# Patient Record
Sex: Female | Born: 2000 | Race: White | Hispanic: No | Smoking: Never smoker
Health system: Southern US, Community
[De-identification: ages and names within clinical notes are randomized; demographics above are authoritative.]

## PROBLEM LIST (undated history)

## (undated) DIAGNOSIS — M419 Scoliosis, unspecified: Secondary | ICD-10-CM

## (undated) DIAGNOSIS — Q760 Spina bifida occulta: Secondary | ICD-10-CM

## (undated) HISTORY — DX: Scoliosis, unspecified: M41.9

## (undated) HISTORY — DX: Spina bifida occulta: Q76.0

## (undated) HISTORY — PX: CHOANAL ADENIODECTOMY: SHX1340

---
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
## 2021-02-02 ENCOUNTER — Other Ambulatory Visit: Payer: Self-pay | Admitting: Obstetrics & Gynecology

## 2021-02-02 DIAGNOSIS — O3680X0 Pregnancy with inconclusive fetal viability, not applicable or unspecified: Secondary | ICD-10-CM

## 2021-02-03 ENCOUNTER — Other Ambulatory Visit: Payer: Self-pay | Admitting: Obstetrics & Gynecology

## 2021-02-03 ENCOUNTER — Other Ambulatory Visit: Payer: Medicaid Other

## 2021-02-03 ENCOUNTER — Other Ambulatory Visit: Payer: Self-pay

## 2021-02-03 ENCOUNTER — Ambulatory Visit (INDEPENDENT_AMBULATORY_CARE_PROVIDER_SITE_OTHER): Payer: Medicaid Other

## 2021-02-03 DIAGNOSIS — O3680X0 Pregnancy with inconclusive fetal viability, not applicable or unspecified: Secondary | ICD-10-CM

## 2021-02-03 DIAGNOSIS — Z3A08 8 weeks gestation of pregnancy: Secondary | ICD-10-CM

## 2021-02-03 NOTE — Progress Notes (Signed)
[place] 5+2 wks GS,no fetal pole or YS visualized,GS 7 mm,normal ovaries,small amount of simple cul de sac fluid ,lab and f/u ultrasound per [employer]

## 2021-02-04 LAB — BETA HCG QUANT (REF LAB): hCG Quant: 4198 m[IU]/mL

## 2021-02-15 ENCOUNTER — Other Ambulatory Visit: Payer: Self-pay | Admitting: Adult Health

## 2021-02-15 DIAGNOSIS — O3680X0 Pregnancy with inconclusive fetal viability, not applicable or unspecified: Secondary | ICD-10-CM

## 2021-02-17 ENCOUNTER — Ambulatory Visit (INDEPENDENT_AMBULATORY_CARE_PROVIDER_SITE_OTHER): Payer: Medicaid Other

## 2021-02-17 ENCOUNTER — Other Ambulatory Visit: Payer: Self-pay

## 2021-02-17 ENCOUNTER — Encounter: Payer: Self-pay | Admitting: Obstetrics & Gynecology

## 2021-02-17 DIAGNOSIS — Z3A10 10 weeks gestation of pregnancy: Secondary | ICD-10-CM | POA: Diagnosis not present

## 2021-02-17 DIAGNOSIS — O3680X0 Pregnancy with inconclusive fetal viability, not applicable or unspecified: Secondary | ICD-10-CM

## 2021-02-17 NOTE — Progress Notes (Signed)
[place] 6+5 wks,single IUP with YS,CRL 8.7 mm,normal ovaries,FHR 125 bpm

## 2021-03-24 ENCOUNTER — Other Ambulatory Visit: Payer: Self-pay | Admitting: Obstetrics & Gynecology

## 2021-03-24 DIAGNOSIS — Z3682 Encounter for antenatal screening for nuchal translucency: Secondary | ICD-10-CM

## 2021-03-25 ENCOUNTER — Other Ambulatory Visit: Payer: Self-pay

## 2021-03-25 ENCOUNTER — Ambulatory Visit: Payer: Medicaid Other | Admitting: *Deleted

## 2021-03-25 ENCOUNTER — Ambulatory Visit (INDEPENDENT_AMBULATORY_CARE_PROVIDER_SITE_OTHER): Payer: Medicaid Other | Admitting: Advanced Practice Midwife

## 2021-03-25 ENCOUNTER — Ambulatory Visit (INDEPENDENT_AMBULATORY_CARE_PROVIDER_SITE_OTHER): Payer: Medicaid Other

## 2021-03-25 ENCOUNTER — Encounter: Payer: Self-pay | Admitting: Advanced Practice Midwife

## 2021-03-25 VITALS — BP 108/75 | HR 76 | Wt 100.5 lb

## 2021-03-25 DIAGNOSIS — Z3402 Encounter for supervision of normal first pregnancy, second trimester: Secondary | ICD-10-CM

## 2021-03-25 DIAGNOSIS — Z3401 Encounter for supervision of normal first pregnancy, first trimester: Secondary | ICD-10-CM

## 2021-03-25 DIAGNOSIS — Z3A11 11 weeks gestation of pregnancy: Secondary | ICD-10-CM

## 2021-03-25 DIAGNOSIS — Z3682 Encounter for antenatal screening for nuchal translucency: Secondary | ICD-10-CM

## 2021-03-25 DIAGNOSIS — Z3400 Encounter for supervision of normal first pregnancy, unspecified trimester: Secondary | ICD-10-CM | POA: Insufficient documentation

## 2021-03-25 LAB — POCT URINALYSIS DIPSTICK OB
Blood, UA: NEGATIVE
Glucose, UA: NEGATIVE
Ketones, UA: NEGATIVE
Leukocytes, UA: NEGATIVE
Nitrite, UA: NEGATIVE
POC,PROTEIN,UA: NEGATIVE

## 2021-03-25 NOTE — Patient Instructions (Signed)
Beverly Hopkins, I greatly value your feedback.  If you receive a survey following your visit with [place] today, we appreciate you taking the time to fill it out.  Thanks, [name], DNP, CNM  [facility] HAS MOVED!!! It is now [facility] ([address] [place], [place] [address]) Entrance located off of [address] Free 24/7 valet parking   Nausea & Vomiting Have saltine crackers or pretzels by your bed and eat a few bites before you raise your head out of bed in the morning Eat small frequent meals throughout the day instead of large meals Drink plenty of fluids throughout the day to stay hydrated, just don't drink a lot of fluids with your meals.  This can make your stomach fill up faster making you feel sick Do not brush your teeth right after you eat Products with real ginger are good for nausea, like ginger ale and ginger hard candy Make sure it says made with real ginger! Sucking on sour candy like lemon heads is also good for nausea If your prenatal vitamins make you nauseated, take them at night so you will sleep through the nausea Sea Bands If you feel like you need medicine for the nausea & vomiting please let us know If you are unable to keep any fluids or food down please let us know   Constipation Drink plenty of fluid, preferably water, throughout the day Eat foods high in fiber such as fruits, vegetables, and grains Exercise, such as walking, is a good way to keep your bowels regular Drink warm fluids, especially warm prune juice, or decaf coffee Eat a 1/2 cup of real oatmeal (not instant), 1/2 cup applesauce, and 1/2-1 cup warm prune juice every day If needed, you may take Colace (docusate sodium) stool softener once or twice a day to help keep the stool soft.  If you still are having problems with constipation, you may take Miralax once daily as needed to help keep your bowels regular.   Home Blood Pressure Monitoring for  Patients   Your provider has recommended that you check your blood pressure (BP) at least once a week at home. If you do not have a blood pressure cuff at home, one will be provided for you. Contact your provider if you have not received your monitor within 1 week.   Helpful Tips for Accurate Home Blood Pressure Checks  Don't smoke, exercise, or drink caffeine 30 minutes before checking your BP Use the restroom before checking your BP (a full bladder can raise your pressure) Relax in a comfortable upright chair Feet on the ground Left arm resting comfortably on a flat surface at the level of your heart Legs uncrossed Back supported Sit quietly and don't talk Place the cuff on your bare arm Adjust snuggly, so that only two fingertips can fit between your skin and the top of the cuff Check 2 readings separated by at least one minute Keep a log of your BP readings For a visual, please reference this diagram: http://ccnc.care/bpdiagram  Provider Name: [facility]     Phone: [phone]  Zone 1: ALL CLEAR  Continue to monitor your symptoms:  BP reading is less than 140 (top number) or less than 90 (bottom number)  No right upper stomach pain No headaches or seeing spots No feeling nauseated or throwing up No swelling in face and hands  Zone 2: CAUTION Call your doctor's office for any of the following:  BP  reading is greater than 140 (top number) or greater than 90 (bottom number)  Stomach pain under your ribs in the middle or right side Headaches or seeing spots Feeling nauseated or throwing up Swelling in face and hands  Zone 3: EMERGENCY  Seek immediate medical care if you have any of the following:  BP reading is greater than160 (top number) or greater than 110 (bottom number) Severe headaches not improving with Tylenol Serious difficulty catching your breath Any worsening symptoms from Zone 2    First Trimester of Pregnancy The first trimester of pregnancy is from  week 1 until the end of week 12 (months 1 through 3). A week after a sperm fertilizes an egg, the egg will implant on the wall of the uterus. This embryo will begin to develop into a baby. Genes from you and your partner are forming the baby. The female genes determine whether the baby is a boy or a girl. At 6-8 weeks, the eyes and face are formed, and the heartbeat can be seen on ultrasound. At the end of 12 weeks, all the baby's organs are formed.  Now that you are pregnant, you will want to do everything you can to have a healthy baby. Two of the most important things are to get good prenatal care and to follow your health care provider's instructions. Prenatal care is all the medical care you receive before the baby's birth. This care will help prevent, find, and treat any problems during the pregnancy and childbirth. BODY CHANGES Your body goes through many changes during pregnancy. The changes vary from woman to woman.  You may gain or lose a couple of pounds at first. You may feel sick to your stomach (nauseous) and throw up (vomit). If the vomiting is uncontrollable, call your health care provider. You may tire easily. You may develop headaches that can be relieved by medicines approved by your health care provider. You may urinate more often. Painful urination may mean you have a bladder infection. You may develop heartburn as a result of your pregnancy. You may develop constipation because certain hormones are causing the muscles that push waste through your intestines to slow down. You may develop hemorrhoids or swollen, bulging veins (varicose veins). Your breasts may begin to grow larger and become tender. Your nipples may stick out more, and the tissue that surrounds them (areola) may become darker. Your gums may bleed and may be sensitive to brushing and flossing. Dark spots or blotches (chloasma, mask of pregnancy) may develop on your face. This will likely fade after the baby is  born. Your menstrual periods will stop. You may have a loss of appetite. You may develop cravings for certain kinds of food. You may have changes in your emotions from day to day, such as being excited to be pregnant or being concerned that something may go wrong with the pregnancy and baby. You may have more vivid and strange dreams. You may have changes in your hair. These can include thickening of your hair, rapid growth, and changes in texture. Some women also have hair loss during or after pregnancy, or hair that feels dry or thin. Your hair will most likely return to normal after your baby is born. WHAT TO EXPECT AT YOUR PRENATAL VISITS During a routine prenatal visit: You will be weighed to make sure you and the baby are growing normally. Your blood pressure will be taken. Your abdomen will be measured to track your baby's growth. The fetal heartbeat  will be listened to starting around week 10 or 12 of your pregnancy. Test results from any previous visits will be discussed. Your health care provider may ask you: How you are feeling. If you are feeling the baby move. If you have had any abnormal symptoms, such as leaking fluid, bleeding, severe headaches, or abdominal cramping. If you have any questions. Other tests that may be performed during your first trimester include: Blood tests to find your blood type and to check for the presence of any previous infections. They will also be used to check for low iron levels (anemia) and Rh antibodies. Later in the pregnancy, blood tests for diabetes will be done along with other tests if problems develop. Urine tests to check for infections, diabetes, or protein in the urine. An ultrasound to confirm the proper growth and development of the baby. An amniocentesis to check for possible genetic problems. Fetal screens for spina bifida and Down syndrome. You may need other tests to make sure you and the baby are doing well. HOME CARE  INSTRUCTIONS  Medicines Follow your health care provider's instructions regarding medicine use. Specific medicines may be either safe or unsafe to take during pregnancy. Take your prenatal vitamins as directed. If you develop constipation, try taking a stool softener if your health care provider approves. Diet Eat regular, well-balanced meals. Choose a variety of foods, such as meat or vegetable-based protein, fish, milk and low-fat dairy products, vegetables, fruits, and whole grain breads and cereals. Your health care provider will help you determine the amount of weight gain that is right for you. Avoid raw meat and uncooked cheese. These carry germs that can cause birth defects in the baby. Eating four or five small meals rather than three large meals a day may help relieve nausea and vomiting. If you start to feel nauseous, eating a few soda crackers can be helpful. Drinking liquids between meals instead of during meals also seems to help nausea and vomiting. If you develop constipation, eat more high-fiber foods, such as fresh vegetables or fruit and whole grains. Drink enough fluids to keep your urine clear or pale yellow. Activity and Exercise Exercise only as directed by your health care provider. Exercising will help you: Control your weight. Stay in shape. Be prepared for labor and delivery. Experiencing pain or cramping in the lower abdomen or low back is a good sign that you should stop exercising. Check with your health care provider before continuing normal exercises. Try to avoid standing for long periods of time. Move your legs often if you must stand in one place for a long time. Avoid heavy lifting. Wear low-heeled shoes, and practice good posture. You may continue to have sex unless your health care provider directs you otherwise. Relief of Pain or Discomfort Wear a good support bra for breast tenderness.   Take warm sitz baths to soothe any pain or discomfort caused by  hemorrhoids. Use hemorrhoid cream if your health care provider approves.   Rest with your legs elevated if you have leg cramps or low back pain. If you develop varicose veins in your legs, wear support hose. Elevate your feet for 15 minutes, 3-4 times a day. Limit salt in your diet. Prenatal Care Schedule your prenatal visits by the twelfth week of pregnancy. They are usually scheduled monthly at first, then more often in the last 2 months before delivery. Write down your questions. Take them to your prenatal visits. Keep all your prenatal visits as directed by  your health care provider. Safety Wear your seat belt at all times when driving. Make a list of emergency phone numbers, including numbers for family, friends, the hospital, and police and fire departments. General Tips Ask your health care provider for a referral to a local prenatal education class. Begin classes no later than at the beginning of month 6 of your pregnancy. Ask for help if you have counseling or nutritional needs during pregnancy. Your health care provider can offer advice or refer you to specialists for help with various needs. Do not use hot tubs, steam rooms, or saunas. Do not douche or use tampons or scented sanitary pads. Do not cross your legs for long periods of time. Avoid cat litter boxes and soil used by cats. These carry germs that can cause birth defects in the baby and possibly loss of the fetus by miscarriage or stillbirth. Avoid all smoking, herbs, alcohol, and medicines not prescribed by your health care provider. Chemicals in these affect the formation and growth of the baby. Schedule a dentist appointment. At home, brush your teeth with a soft toothbrush and be gentle when you floss. SEEK MEDICAL CARE IF:  You have dizziness. You have mild pelvic cramps, pelvic pressure, or nagging pain in the abdominal area. You have persistent nausea, vomiting, or diarrhea. You have a bad smelling vaginal  discharge. You have pain with urination. You notice increased swelling in your face, hands, legs, or ankles. SEEK IMMEDIATE MEDICAL CARE IF:  You have a fever. You are leaking fluid from your vagina. You have spotting or bleeding from your vagina. You have severe abdominal cramping or pain. You have rapid weight gain or loss. You vomit blood or material that looks like coffee grounds. You are exposed to [place] measles and have never had them. You are exposed to fifth disease or chickenpox. You develop a severe headache. You have shortness of breath. You have any kind of trauma, such as from a fall or a car accident. Document Released: 06/21/2001 Document Revised: 11/11/2013 Document Reviewed: 05/07/2013 [facility] Patient Information 2015 [place], [place]. This information is not intended to replace advice given to you by your health care provider. Make sure you discuss any questions you have with your health care provider.  Coronavirus (COVID-19) Are you at risk?  Are you at risk for the Coronavirus (COVID-19)?  To be considered HIGH RISK for Coronavirus (COVID-19), you have to meet the following criteria:  Traveled to [place], [place], [place], [place] or [place];  and have fever, cough, and shortness of breath within the last 2 weeks of travel OR Been in close contact with a person diagnosed with COVID-19 within the last 2 weeks and have fever, cough, and shortness of breath IF YOU DO NOT MEET THESE CRITERIA, YOU ARE CONSIDERED LOW RISK FOR COVID-19.  What to do if you are HIGH RISK for COVID-19?  If you are having a medical emergency, call 911. Seek medical care right away. Before you go to a doctor's office, urgent care or emergency department, call ahead and tell them about your recent travel, contact with someone diagnosed with COVID-19, and your symptoms. You should receive instructions from your physician's office regarding next steps of care.  When you arrive at healthcare provider,  tell the healthcare staff immediately you have returned from visiting [place], [place], [place], [place] or [place]; in the last two weeks or you have been in close contact with a person diagnosed with COVID-19 in the last 2 weeks.  Tell the health care staff about your symptoms: fever, cough and shortness of breath. After you have been seen by a medical provider, you will be either: Tested for (COVID-19) and discharged home on quarantine except to seek medical care if symptoms worsen, and asked to  Stay home and avoid contact with others until you get your results (4-5 days)  Avoid travel on public transportation if possible (such as bus, train, or airplane) or Sent to the Emergency Department by EMS for evaluation, COVID-19 testing, and possible admission depending on your condition and test results.  What to do if you are LOW RISK for COVID-19?  Reduce your risk of any infection by using the same precautions used for avoiding the common cold or flu:  Wash your hands often with soap and warm water for at least 20 seconds.  If soap and water are not readily available, use an alcohol-based hand sanitizer with at least 60% alcohol.  If coughing or sneezing, cover your mouth and nose by coughing or sneezing into the elbow areas of your shirt or coat, into a tissue or into your sleeve (not your hands). Avoid shaking hands with others and consider head nods or verbal greetings only. Avoid touching your eyes, nose, or mouth with unwashed hands.  Avoid close contact with people who are sick. Avoid places or events with large numbers of people in one location, like concerts or sporting events. Carefully consider travel plans you have or are making. If you are planning any travel outside or inside the [place], visit the CDC's Travelers' Health webpage for the latest health notices. If you have some symptoms but not all symptoms, continue to monitor at home and seek medical attention if your symptoms worsen. If  you are having a medical emergency, call 911.   ADDITIONAL HEALTHCARE OPTIONS FOR [place] / e-Visit: [email or web address]         [facility]: [facility]: [id number]                   [facility]: [phone]     Safe Medications in Pregnancy   Acne: Benzoyl Peroxide Salicylic Acid  Backache/Headache: Tylenol: 2 regular strength every 4 hours OR              2 Extra strength every 6 hours  Colds/Coughs/Allergies: Benadryl (alcohol free) 25 mg every 6 hours as needed Breath right strips Claritin Cepacol throat lozenges Chloraseptic throat spray Cold-Eeze- up to three times per day Cough drops, alcohol free Flonase (by prescription only) Guaifenesin Mucinex Robitussin DM (plain only, alcohol free) Saline nasal spray/drops Sudafed (pseudoephedrine) & Actifed ** use only after [redacted] weeks gestation and if you do not have high blood pressure Tylenol Vicks Vaporub Zinc lozenges Zyrtec   Constipation: Colace Ducolax suppositories Fleet enema Glycerin suppositories Metamucil Milk of magnesia Miralax Senokot Smooth move tea  Diarrhea: Kaopectate Imodium A-D  *NO pepto Bismol  Hemorrhoids: Anusol Anusol HC Preparation H Tucks  Indigestion: Tums Maalox Mylanta Zantac  Pepcid  Insomnia: Benadryl (alcohol free) 36m every 6 hours as needed Tylenol PM Unisom, no Gelcaps  Leg Cramps: Tums MagGel  Nausea/Vomiting:  Bonine Dramamine Emetrol Ginger extract Sea bands Meclizine  Nausea medication to take during pregnancy:  Unisom (doxylamine succinate 25 mg tablets) Take one tablet daily at bedtime. If symptoms are not adequately controlled, the dose can be increased to a maximum recommended dose of two tablets daily (1/2 tablet in  the morning, 1/2 tablet mid-afternoon and one at bedtime). Vitamin B6 160m tablets. Take one  tablet twice a day (up to 200 mg per day).  Skin Rashes: Aveeno products Benadryl cream or 250mevery 6 hours as needed Calamine Lotion 1% cortisone cream  Yeast infection: Gyne-lotrimin 7 Monistat 7   **If taking multiple medications, please check labels to avoid duplicating the same active ingredients **take medication as directed on the label ** Do not exceed 4000 mg of tylenol in 24 hours **Do not take medications that contain aspirin or ibuprofen

## 2021-03-25 NOTE — Progress Notes (Signed)
[place] 11+6 wks,measurements c/w dates,CRL 57.90 mm,FHR 155 bpm,NB present,NT 1.6 mm,normal ovaries

## 2021-03-25 NOTE — Progress Notes (Signed)
INITIAL OBSTETRICAL VISIT Patient name: Beverly Hopkins MRN [id number]  Date of birth: [birth year] Chief Complaint:   Initial Prenatal Visit  History of Present Illness:   Beverly Hopkins is a 20 y.o. G1P0 Caucasian female at [redacted]w[redacted]d by [place] at 6 weeks with an Estimated Date of Delivery: 10/08/21 being seen today for her initial obstetrical visit.   Her obstetrical history is significant for first baby.   Today she reports no complaints.  Depression screen [facility] 2/9 03/25/2021  Decreased Interest 0  Down, Depressed, Hopeless 0  PHQ - 2 Score 0  Altered sleeping 1  Tired, decreased energy 0  Change in appetite 0  Feeling bad or failure about yourself  0  Trouble concentrating 0  Moving slowly or fidgety/restless 0  Suicidal thoughts 0  PHQ-9 Score 1    Patient's last menstrual period was 12/07/2020 (approximate). Last pap n/a. Review of Systems:   Pertinent items are noted in HPI Denies cramping/contractions, leakage of fluid, vaginal bleeding, abnormal vaginal discharge w/ itching/odor/irritation, headaches, visual changes, shortness of breath, chest pain, abdominal pain, severe nausea/vomiting, or problems with urination or bowel movements unless otherwise stated above.  Pertinent History Reviewed:  Reviewed past medical,surgical, social, obstetrical and family history.  Reviewed problem list, medications and allergies. OB History  Gravida Para Term Preterm AB Living  1            SAB IAB Ectopic Multiple Live Births               # Outcome Date GA Lbr Len/2nd Weight Sex Delivery Anes PTL Lv  1 Current            Physical Assessment:   Vitals:   03/25/21 0852  BP: 108/75  Pulse: 76  Weight: 100 lb 8 oz (45.6 kg)  There is no height or weight on file to calculate BMI.       Physical Examination:  General appearance - well appearing, and in no distress  Mental status - alert, oriented to person, place, and time  Psych:  She has a normal mood and affect  Skin - warm and  dry, normal color, no suspicious lesions noted  Chest - effort normal  Heart - normal rate and regular rhythm  Abdomen - soft, nontender  Extremities:  No swelling or varicosities noted   TODAY'S NT [place] 11+6 wks,measurements c/w dates,CRL 57.90 mm,FHR 155 bpm,NB present,NT 1.6 mm,normal ovaries  Results for orders placed or performed in visit on 03/25/21 (from the past 24 hour(s))  POC Urinalysis Dipstick OB   Collection Time: 03/25/21  9:36 AM  Result Value Ref Range   Color, UA     Clarity, UA     Glucose, UA Negative Negative   Bilirubin, UA     Ketones, UA neg    Spec Grav, UA     Blood, UA neg    pH, UA     POC,PROTEIN,UA Negative Negative, Trace, Small (1+), Moderate (2+), Large (3+), 4+   Urobilinogen, UA     Nitrite, UA neg    Leukocytes, UA Negative Negative   Appearance     Odor      Assessment & Plan:  1) Low-Risk Pregnancy G1P0 at [redacted]w[redacted]d with an Estimated Date of Delivery: 10/08/21   2) Initial OB visit  3) Spina bifida oculta/mild scoliosis:  MRI from 2019 in care everywhere, note sent to anesthesia to review/make a recommendation   Meds: No orders of the defined types were placed in this  encounter.   Initial labs obtained Continue prenatal vitamins Reviewed n/v relief measures and warning s/s to report Reviewed recommended weight gain based on pre-gravid BMI Encouraged well-balanced diet Genetic & carrier screening discussed: requests Panorama, NT/IT, and Horizon , declines AFP Ultrasound discussed; fetal survey: requested [facility] completed> form faxed if has or is planning to apply for medicaid The nature of [facility] [employer] with multiple MDs and other Advanced Practice Providers was explained to patient; also emphasized that fellows, residents, and students are part of our team. Has home bp cuff.. Check bp weekly, let us know if >140/90.        Scarlette Calico Cresenzo-Dishmon 7:52 PM

## 2021-03-26 LAB — PMP SCREEN PROFILE (10S), URINE
Amphetamine Scrn, Ur: NEGATIVE ng/mL
BARBITURATE SCREEN URINE: NEGATIVE ng/mL
BENZODIAZEPINE SCREEN, URINE: NEGATIVE ng/mL
CANNABINOIDS UR QL SCN: NEGATIVE ng/mL
Cocaine (Metab) Scrn, Ur: NEGATIVE ng/mL
Creatinine(Crt), U: 96.2 mg/dL (ref 20.0–300.0)
Methadone Screen, Urine: NEGATIVE ng/mL
OXYCODONE+OXYMORPHONE UR QL SCN: NEGATIVE ng/mL
Opiate Scrn, Ur: NEGATIVE ng/mL
Ph of Urine: 8.5 (ref 4.5–8.9)
Phencyclidine Qn, Ur: NEGATIVE ng/mL
Propoxyphene Scrn, Ur: NEGATIVE ng/mL

## 2021-03-27 LAB — CBC/D/PLT+RPR+RH+ABO+RUBIGG...
Antibody Screen: NEGATIVE
Basophils Absolute: 0 10*3/uL (ref 0.0–0.2)
Basos: 0 %
EOS (ABSOLUTE): 0.1 10*3/uL (ref 0.0–0.4)
Eos: 1 %
HCV Ab: <0.1 s/co ratio (ref 0.0–0.9)
HIV Screen 4th Generation wRfx: NONREACTIVE
Hematocrit: 40 % (ref 34.0–46.6)
Hemoglobin: 13.4 g/dL (ref 11.1–15.9)
Hepatitis B Surface Ag: NEGATIVE
Immature Grans (Abs): 0 10*3/uL (ref 0.0–0.1)
Immature Granulocytes: 0 %
Lymphocytes Absolute: 2 10*3/uL (ref 0.7–3.1)
Lymphs: 23 %
MCH: 30.4 pg (ref 26.6–33.0)
MCHC: 33.5 g/dL (ref 31.5–35.7)
MCV: 91 fL (ref 79–97)
Monocytes Absolute: 0.5 10*3/uL (ref 0.1–0.9)
Monocytes: 6 %
Neutrophils Absolute: 6.2 10*3/uL (ref 1.4–7.0)
Neutrophils: 70 %
Platelets: 358 10*3/uL (ref 150–450)
RBC: 4.41 x10E6/uL (ref 3.77–5.28)
RDW: 12.3 % (ref 11.7–15.4)
RPR Ser Ql: NONREACTIVE
Rh Factor: POSITIVE
Rubella Antibodies, IGG: 1.29 index (ref >0.99)
WBC: 8.8 10*3/uL (ref 3.4–10.8)

## 2021-03-27 LAB — INTEGRATED 1
Crown Rump Length: 57.9 mm
Gest. Age on Collection Date: 12.1 weeks
Maternal Age at EDD: 19.3 yr
Nuchal Translucency (NT): 1.6 mm
Number of Fetuses: 1
PAPP-A Value: 1182.5 ng/mL
Weight: 100 [lb_av]

## 2021-03-27 LAB — HCV INTERPRETATION

## 2021-03-28 LAB — GC/CHLAMYDIA PROBE AMP
Chlamydia trachomatis, NAA: NEGATIVE
Neisseria Gonorrhoeae by PCR: NEGATIVE

## 2021-03-31 ENCOUNTER — Other Ambulatory Visit: Payer: Self-pay | Admitting: Advanced Practice Midwife

## 2021-03-31 DIAGNOSIS — R8271 Bacteriuria: Secondary | ICD-10-CM | POA: Insufficient documentation

## 2021-03-31 LAB — URINE CULTURE

## 2021-03-31 MED ORDER — SULFAMETHOXAZOLE-TRIMETHOPRIM 800-160 MG PO TABS: 1.0000 | ORAL_TABLET | Freq: Two times a day (BID) | ORAL | 0 refills | Status: DC

## 2021-03-31 NOTE — Progress Notes (Signed)
Septra for ASB/PCN allergy

## 2021-04-14 ENCOUNTER — Encounter: Payer: Self-pay | Admitting: Advanced Practice Midwife

## 2021-04-22 ENCOUNTER — Ambulatory Visit (INDEPENDENT_AMBULATORY_CARE_PROVIDER_SITE_OTHER): Payer: Medicaid Other | Admitting: Obstetrics & Gynecology

## 2021-04-22 ENCOUNTER — Other Ambulatory Visit: Payer: Self-pay

## 2021-04-22 ENCOUNTER — Encounter: Payer: Self-pay | Admitting: Obstetrics & Gynecology

## 2021-04-22 VITALS — BP 99/64 | HR 88 | Wt 107.0 lb

## 2021-04-22 DIAGNOSIS — Z3402 Encounter for supervision of normal first pregnancy, second trimester: Secondary | ICD-10-CM

## 2021-04-22 DIAGNOSIS — Z3A15 15 weeks gestation of pregnancy: Secondary | ICD-10-CM

## 2021-04-22 NOTE — Progress Notes (Signed)
   LOW-RISK PREGNANCY VISIT Patient name: Beverly Hopkins MRN [id number]  Date of birth: 10/20/2000 Chief Complaint:   Routine Prenatal Visit  History of Present Illness:   Beverly Hopkins is a 20 y.o. G1P0 female at [redacted]w[redacted]d with an Estimated Date of Delivery: 10/08/21 being seen today for ongoing management of a low-risk pregnancy.  Depression screen [facility] 2/9 03/25/2021  Decreased Interest 0  Down, Depressed, Hopeless 0  PHQ - 2 Score 0  Altered sleeping 1  Tired, decreased energy 0  Change in appetite 0  Feeling bad or failure about yourself  0  Trouble concentrating 0  Moving slowly or fidgety/restless 0  Suicidal thoughts 0  PHQ-9 Score 1    Today she reports no complaints. Contractions: Not present. Vag. Bleeding: None.  Movement: Absent. denies leaking of fluid. Review of Systems:   Pertinent items are noted in HPI Denies abnormal vaginal discharge w/ itching/odor/irritation, headaches, visual changes, shortness of breath, chest pain, abdominal pain, severe nausea/vomiting, or problems with urination or bowel movements unless otherwise stated above. Pertinent History Reviewed:  Reviewed past medical,surgical, social, obstetrical and family history.  Reviewed problem list, medications and allergies. Physical Assessment:   Vitals:   04/22/21 0910  BP: 99/64  Pulse: 88  Weight: 107 lb (48.5 kg)  There is no height or weight on file to calculate BMI.        Physical Examination:   General appearance: Well appearing, and in no distress  Mental status: Alert, oriented to person, place, and time  Skin: Warm & dry  Cardiovascular: Normal heart rate noted  Respiratory: Normal respiratory effort, no distress  Abdomen: Soft, gravid, nontender  Pelvic: Cervical exam deferred         Extremities: Edema: None  Fetal Status:     Movement: Absent    Chaperone: n/a    No results found for this or any previous visit (from the past 24 hour(s)).  Assessment & Plan:  1) Low-risk  pregnancy G1P0 at [redacted]w[redacted]d with an Estimated Date of Delivery: 10/08/21   2) recommended prenatal yoga due to back issues,    Meds: No orders of the defined types were placed in this encounter.  Labs/procedures today: PN2  Plan:  Continue routine obstetrical care  Next visit: prefers in person    Reviewed: Preterm labor symptoms and general obstetric precautions including but not limited to vaginal bleeding, contractions, leaking of fluid and fetal movement were reviewed in detail with the patient.  All questions were answered.  home bp cuff. Rx faxed to . Check bp weekly, let us know if >140/90.   Follow-up: Return in about 4 weeks (around 05/20/2021) for 20 week sono, LROB.  Orders Placed This Encounter  Procedures   US OB Comp + 14 Wk   INTEGRATED 2    Lazaro Arms, MD 04/22/2021 9:52 AM

## 2021-04-24 LAB — INTEGRATED 2
AFP MoM: 1.15
Alpha-Fetoprotein: 48.1 ng/mL
Crown Rump Length: 57.9 mm
DIA MoM: 1.2
DIA Value: 249.8 pg/mL
Estriol, Unconjugated: 1.72 ng/mL
Gest. Age on Collection Date: 12.1 weeks
Gestational Age: 16.1 weeks
Maternal Age at EDD: 19.3 yr
Nuchal Translucency (NT): 1.6 mm
Nuchal Translucency MoM: 1.24
Number of Fetuses: 1
PAPP-A MoM: 0.89
PAPP-A Value: 1182.5 ng/mL
Test Results:: NEGATIVE
Weight: 100 [lb_av]
Weight: 100 [lb_av]
hCG MoM: 1.16
hCG Value: 53.3 IU/mL
uE3 MoM: 1.61

## 2021-05-19 ENCOUNTER — Other Ambulatory Visit: Payer: Self-pay | Admitting: Obstetrics & Gynecology

## 2021-05-19 DIAGNOSIS — Z3402 Encounter for supervision of normal first pregnancy, second trimester: Secondary | ICD-10-CM

## 2021-05-19 DIAGNOSIS — Z363 Encounter for antenatal screening for malformations: Secondary | ICD-10-CM

## 2021-05-20 ENCOUNTER — Ambulatory Visit (INDEPENDENT_AMBULATORY_CARE_PROVIDER_SITE_OTHER): Payer: Medicaid Other

## 2021-05-20 ENCOUNTER — Encounter: Payer: Self-pay | Admitting: Women's Health

## 2021-05-20 ENCOUNTER — Ambulatory Visit (INDEPENDENT_AMBULATORY_CARE_PROVIDER_SITE_OTHER): Payer: Medicaid Other | Admitting: Women's Health

## 2021-05-20 ENCOUNTER — Other Ambulatory Visit: Payer: Self-pay

## 2021-05-20 VITALS — BP 108/67 | HR 87 | Wt 113.0 lb

## 2021-05-20 DIAGNOSIS — Z23 Encounter for immunization: Secondary | ICD-10-CM | POA: Diagnosis not present

## 2021-05-20 DIAGNOSIS — Z3A19 19 weeks gestation of pregnancy: Secondary | ICD-10-CM | POA: Diagnosis not present

## 2021-05-20 DIAGNOSIS — Z363 Encounter for antenatal screening for malformations: Secondary | ICD-10-CM

## 2021-05-20 DIAGNOSIS — R8271 Bacteriuria: Secondary | ICD-10-CM

## 2021-05-20 DIAGNOSIS — Z3402 Encounter for supervision of normal first pregnancy, second trimester: Secondary | ICD-10-CM

## 2021-05-20 DIAGNOSIS — O2342 Unspecified infection of urinary tract in pregnancy, second trimester: Secondary | ICD-10-CM

## 2021-05-20 LAB — POCT URINALYSIS DIPSTICK OB
Blood, UA: NEGATIVE
Glucose, UA: NEGATIVE
Ketones, UA: NEGATIVE
Leukocytes, UA: NEGATIVE
Nitrite, UA: NEGATIVE
POC,PROTEIN,UA: NEGATIVE

## 2021-05-20 NOTE — Progress Notes (Signed)
[place] 19+6 wks,cephalic,cx 4.1 cm,posterior placenta gr 0,FHR 150 bpm,normal ovaries,svp of fluid 3.9 cm,EFW 342 g 68%,anatomy complete,no obvious abnormalities

## 2021-05-20 NOTE — Patient Instructions (Signed)
Beverly Hopkins, thank you for choosing our office today! We appreciate the opportunity to meet your healthcare needs. You may receive a short survey by mail, e-mail, or through [employer]. If you are happy with your care we would appreciate if you could take just a few minutes to complete the survey questions. We read all of your comments and take your feedback very seriously. Thank you again for choosing our office.  Center for [employer] Team at [facility] at [facility] ([address] [place], [place] [address]) Entrance C, located off of [address] Free 24/7 valet parking  Go to [email or web address] to register for FREE online childbirth classes  Call the office [phone]) or go to [facility] if: You begin to severe cramping Your water breaks.  Sometimes it is a big gush of fluid, sometimes it is just a trickle that keeps getting your panties wet or running down your legs You have vaginal bleeding.  It is normal to have a small amount of spotting if your cervix was checked.   [facility] Pediatricians/Family Doctors [facility]): [address] Colette Ribas, [phone]           [facility]: [address], [phone]                [facility] Medicine [facility]): [address], [phone] (call to ask if accepting patients) [facility] Department: [address], [place], [phone]    [facility] Pediatricians/Family Doctors [facility]): [phone] [address], [phone] [facility]: [address] [place], [phone] [facility]: [address], [phone]  [facility] Doctors  [facility]): [phone] [facility]: [address] [phone]   [facility] Doctors [facility]: [address], [phone]  [facility] Doctors  [facility]: [phone], [phone]  Home Blood Pressure Monitoring for Patients   Your provider has recommended that you check your blood pressure (BP) at least once a week at home. If you do not have a blood pressure cuff at home, one will be provided for you. Contact your provider if you have not received your monitor within 1 week.   Helpful Tips for Accurate Home Blood Pressure Checks  Don't smoke, exercise, or drink caffeine 30 minutes before checking your BP Use the restroom before checking your BP (a full bladder can raise your pressure) Relax in a comfortable upright chair Feet on the ground Left arm resting comfortably on a flat surface at the level of your heart Legs uncrossed Back supported Sit quietly and don't talk Place the cuff on your bare arm Adjust snuggly, so that only two fingertips can fit between your skin and the top of the cuff Check 2 readings separated by at least one minute Keep a log of your BP readings For a visual, please reference this diagram: http://ccnc.care/bpdiagram  Provider Name: [facility]     Phone: [phone]  Zone 1: ALL CLEAR  Continue to monitor your symptoms:  BP reading is less than 140 (top number) or less than 90 (bottom number)  No right upper stomach pain No headaches or seeing spots No feeling nauseated or throwing up No swelling in face and hands  Zone 2: CAUTION Call your doctor's office for any of the following:  BP reading is greater than 140 (top number) or greater than  90 (bottom number)  Stomach pain under your ribs in the middle or right side Headaches or seeing spots Feeling nauseated or throwing up Swelling in face and hands  Zone 3: EMERGENCY  Seek immediate medical care if you have any of the following:  BP reading is greater than160 (top number) or greater than 110 (bottom number) Severe headaches not improving with Tylenol Serious difficulty catching your breath Any worsening symptoms from  Zone 2     Second Trimester of Pregnancy The second trimester is from week 14 through week 27 (months 4 through 6). The second trimester is often a time when you feel your best. Your body has adjusted to being pregnant, and you begin to feel better physically. Usually, morning sickness has lessened or quit completely, you may have more energy, and you may have an increase in appetite. The second trimester is also a time when the fetus is growing rapidly. At the end of the sixth month, the fetus is about 9 inches long and weighs about 1 pounds. You will likely begin to feel the baby move (quickening) between 16 and 20 weeks of pregnancy. Body changes during your second trimester Your body continues to go through many changes during your second trimester. The changes vary from woman to woman. Your weight will continue to increase. You will notice your lower abdomen bulging out. You may begin to get stretch marks on your hips, abdomen, and breasts. You may develop headaches that can be relieved by medicines. The medicines should be approved by your health care provider. You may urinate more often because the fetus is pressing on your bladder. You may develop or continue to have heartburn as a result of your pregnancy. You may develop constipation because certain hormones are causing the muscles that push waste through your intestines to slow down. You may develop hemorrhoids or swollen, bulging veins (varicose veins). You may have back pain. This is caused by: Weight gain. Pregnancy hormones that are relaxing the joints in your pelvis. A shift in weight and the muscles that support your balance. Your breasts will continue to grow and they will continue to become tender. Your gums may bleed and may be sensitive to brushing and flossing. Dark spots or blotches (chloasma, mask of pregnancy) may develop on your face. This will likely fade after the baby is born. A dark line from your belly button to  the pubic area (linea nigra) may appear. This will likely fade after the baby is born. You may have changes in your hair. These can include thickening of your hair, rapid growth, and changes in texture. Some women also have hair loss during or after pregnancy, or hair that feels dry or thin. Your hair will most likely return to normal after your baby is born.  What to expect at prenatal visits During a routine prenatal visit: You will be weighed to make sure you and the fetus are growing normally. Your blood pressure will be taken. Your abdomen will be measured to track your baby's growth. The fetal heartbeat will be listened to. Any test results from the previous visit will be discussed.  Your health care provider may ask you: How you are feeling. If you are feeling the baby move. If you have had any abnormal symptoms, such as leaking fluid, bleeding, severe headaches, or abdominal cramping. If you are using any tobacco products, including cigarettes, chewing tobacco, and electronic cigarettes. If you have any questions.  Other tests that may be performed during   your second trimester include: Blood tests that check for: Low iron levels (anemia). High blood sugar that affects pregnant women (gestational diabetes) between 24 and 28 weeks. Rh antibodies. This is to check for a protein on red blood cells (Rh factor). Urine tests to check for infections, diabetes, or protein in the urine. An ultrasound to confirm the proper growth and development of the baby. An amniocentesis to check for possible genetic problems. Fetal screens for spina bifida and Down syndrome. HIV (human immunodeficiency virus) testing. Routine prenatal testing includes screening for HIV, unless you choose not to have this test.  Follow these instructions at home: Medicines Follow your health care provider's instructions regarding medicine use. Specific medicines may be either safe or unsafe to take during  pregnancy. Take a prenatal vitamin that contains at least 600 micrograms (mcg) of folic acid. If you develop constipation, try taking a stool softener if your health care provider approves. Eating and drinking Eat a balanced diet that includes fresh fruits and vegetables, whole grains, good sources of protein such as meat, eggs, or tofu, and low-fat dairy. Your health care provider will help you determine the amount of weight gain that is right for you. Avoid raw meat and uncooked cheese. These carry germs that can cause birth defects in the baby. If you have low calcium intake from food, talk to your health care provider about whether you should take a daily calcium supplement. Limit foods that are high in fat and processed sugars, such as fried and sweet foods. To prevent constipation: Drink enough fluid to keep your urine clear or pale yellow. Eat foods that are high in fiber, such as fresh fruits and vegetables, whole grains, and beans. Activity Exercise only as directed by your health care provider. Most women can continue their usual exercise routine during pregnancy. Try to exercise for 30 minutes at least 5 days a week. Stop exercising if you experience uterine contractions. Avoid heavy lifting, wear low heel shoes, and practice good posture. A sexual relationship may be continued unless your health care provider directs you otherwise. Relieving pain and discomfort Wear a good support bra to prevent discomfort from breast tenderness. Take warm sitz baths to soothe any pain or discomfort caused by hemorrhoids. Use hemorrhoid cream if your health care provider approves. Rest with your legs elevated if you have leg cramps or low back pain. If you develop varicose veins, wear support hose. Elevate your feet for 15 minutes, 3-4 times a day. Limit salt in your diet. Prenatal Care Write down your questions. Take them to your prenatal visits. Keep all your prenatal visits as told by your health  care provider. This is important. Safety Wear your seat belt at all times when driving. Make a list of emergency phone numbers, including numbers for family, friends, the hospital, and police and fire departments. General instructions Ask your health care provider for a referral to a local prenatal education class. Begin classes no later than the beginning of month 6 of your pregnancy. Ask for help if you have counseling or nutritional needs during pregnancy. Your health care provider can offer advice or refer you to specialists for help with various needs. Do not use hot tubs, steam rooms, or saunas. Do not douche or use tampons or scented sanitary pads. Do not cross your legs for long periods of time. Avoid cat litter boxes and soil used by cats. These carry germs that can cause birth defects in the baby and possibly loss of the   fetus by miscarriage or stillbirth. Avoid all smoking, herbs, alcohol, and unprescribed drugs. Chemicals in these products can affect the formation and growth of the baby. Do not use any products that contain nicotine or tobacco, such as cigarettes and e-cigarettes. If you need help quitting, ask your health care provider. Visit your dentist if you have not gone yet during your pregnancy. Use a soft toothbrush to brush your teeth and be gentle when you floss. Contact a health care provider if: You have dizziness. You have mild pelvic cramps, pelvic pressure, or nagging pain in the abdominal area. You have persistent nausea, vomiting, or diarrhea. You have a bad smelling vaginal discharge. You have pain when you urinate. Get help right away if: You have a fever. You are leaking fluid from your vagina. You have spotting or bleeding from your vagina. You have severe abdominal cramping or pain. You have rapid weight gain or weight loss. You have shortness of breath with chest pain. You notice sudden or extreme swelling of your face, hands, ankles, feet, or legs. You  have not felt your baby move in over an hour. You have severe headaches that do not go away when you take medicine. You have vision changes. Summary The second trimester is from week 14 through week 27 (months 4 through 6). It is also a time when the fetus is growing rapidly. Your body goes through many changes during pregnancy. The changes vary from woman to woman. Avoid all smoking, herbs, alcohol, and unprescribed drugs. These chemicals affect the formation and growth your baby. Do not use any tobacco products, such as cigarettes, chewing tobacco, and e-cigarettes. If you need help quitting, ask your health care provider. Contact your health care provider if you have any questions. Keep all prenatal visits as told by your health care provider. This is important. This information is not intended to replace advice given to you by your health care provider. Make sure you discuss any questions you have with your health care provider. Document Released: 06/21/2001 Document Revised: 12/03/2015 Document Reviewed: 08/28/2012 Elsevier Interactive Patient Education  2017 Elsevier Inc.  

## 2021-05-20 NOTE — Progress Notes (Signed)
LOW-RISK PREGNANCY VISIT Patient name: Beverly Hopkins MRN [id number]  Date of birth: December 18, 2000 Chief Complaint:   Routine Prenatal Visit and Pregnancy Ultrasound  History of Present Illness:   Beverly Hopkins is a 20 y.o. G1P0 female at [redacted]w[redacted]d with an Estimated Date of Delivery: 10/08/21 being seen today for ongoing management of a low-risk pregnancy.   Today she reports no complaints. Contractions: Not present. Vag. Bleeding: None.  Movement: Present. denies leaking of fluid.  Depression screen [facility] 2/9 03/25/2021  Decreased Interest 0  Down, Depressed, Hopeless 0  PHQ - 2 Score 0  Altered sleeping 1  Tired, decreased energy 0  Change in appetite 0  Feeling bad or failure about yourself  0  Trouble concentrating 0  Moving slowly or fidgety/restless 0  Suicidal thoughts 0  PHQ-9 Score 1     GAD 7 : Generalized Anxiety Score 03/25/2021  Nervous, Anxious, on Edge 0  Control/stop worrying 0  Worry too much - different things 0  Trouble relaxing 0  Restless 0  Easily annoyed or irritable 1  Afraid - awful might happen 0  Total GAD 7 Score 1      Review of Systems:   Pertinent items are noted in HPI Denies abnormal vaginal discharge w/ itching/odor/irritation, headaches, visual changes, shortness of breath, chest pain, abdominal pain, severe nausea/vomiting, or problems with urination or bowel movements unless otherwise stated above. Pertinent History Reviewed:  Reviewed past medical,surgical, social, obstetrical and family history.  Reviewed problem list, medications and allergies. Physical Assessment:   Vitals:   05/20/21 1033  BP: 108/67  Pulse: 87  Weight: 113 lb (51.3 kg)  There is no height or weight on file to calculate BMI.        Physical Examination:   General appearance: Well appearing, and in no distress  Mental status: Alert, oriented to person, place, and time  Skin: Warm & dry  Cardiovascular: Normal heart rate noted  Respiratory: Normal  respiratory effort, no distress  Abdomen: Soft, gravid, nontender  Pelvic: Cervical exam deferred         Extremities: Edema: None  Fetal Status: Fetal Heart Rate (bpm): 150 u/s   Movement: Present  [place] 19+6 wks,cephalic,cx 4.1 cm,posterior placenta gr 0,FHR 150 bpm,normal ovaries,svp of fluid 3.9 cm,EFW 342 g 68%,anatomy complete,no obvious abnormalities   Chaperone: N/A   Results for orders placed or performed in visit on 05/20/21 (from the past 24 hour(s))  POC Urinalysis Dipstick OB   Collection Time: 05/20/21 10:38 AM  Result Value Ref Range   Color, UA     Clarity, UA     Glucose, UA Negative Negative   Bilirubin, UA     Ketones, UA neg    Spec Grav, UA     Blood, UA neg    pH, UA     POC,PROTEIN,UA Negative Negative, Trace, Small (1+), Moderate (2+), Large (3+), 4+   Urobilinogen, UA     Nitrite, UA neg    Leukocytes, UA Negative Negative   Appearance     Odor      Assessment & Plan:  1) Low-risk pregnancy G1P0 at [redacted]w[redacted]d with an Estimated Date of Delivery: 10/08/21   2) Recent ASB, urine cx poc today   Meds: No orders of the defined types were placed in this encounter.  Labs/procedures today: U/S and urine culture  Plan:  Continue routine obstetrical care  Next visit: prefers in person    Reviewed: Preterm labor symptoms and general obstetric precautions  including but not limited to vaginal bleeding, contractions, leaking of fluid and fetal movement were reviewed in detail with the patient.  All questions were answered. Does have home bp cuff. Office bp cuff given: not applicable. Check bp weekly, let us know if consistently >140 and/or >90.  Follow-up: Return in about 4 weeks (around 06/17/2021) for LROB, CNM, in person.  Future Appointments  Date Time Provider Department Center  06/17/2021  8:50 AM Cheral Marker, CNM [facility]    Orders Placed This Encounter  Procedures   Urine Culture   POC Urinalysis Dipstick OB   Cheral Marker CNM,  [facility] 05/20/2021 11:15 AM

## 2021-05-26 LAB — URINE CULTURE

## 2021-06-17 ENCOUNTER — Encounter: Payer: Self-pay | Admitting: Women's Health

## 2021-06-17 ENCOUNTER — Ambulatory Visit (INDEPENDENT_AMBULATORY_CARE_PROVIDER_SITE_OTHER): Payer: Medicaid Other | Admitting: Women's Health

## 2021-06-17 ENCOUNTER — Other Ambulatory Visit: Payer: Self-pay

## 2021-06-17 VITALS — BP 109/73 | HR 97 | Wt 126.0 lb

## 2021-06-17 DIAGNOSIS — Z3402 Encounter for supervision of normal first pregnancy, second trimester: Secondary | ICD-10-CM

## 2021-06-17 NOTE — Patient Instructions (Signed)
Beverly Hopkins, thank you for choosing our office today! We appreciate the opportunity to meet your healthcare needs. You may receive a short survey by mail, e-mail, or through [employer]. If you are happy with your care we would appreciate if you could take just a few minutes to complete the survey questions. We read all of your comments and take your feedback very seriously. Thank you again for choosing our office.  Center for [employer] Team at [facility]  [facility] at [facility] ([address] [place], [place] [address]) Entrance C, located off of [address] Free 24/7 valet parking   You will have your sugar test next visit.  Please do not eat or drink anything after midnight the night before you come, not even water.  You will be here for at least two hours.  Please make an appointment online for the bloodwork at [email or web address] for 8:00am (or as close to this as possible). Make sure you select the [facility] service center.   CLASSES: Go to [email or web address] to register for classes (childbirth, breastfeeding, waterbirth, infant CPR, daddy bootcamp, etc.)  Call the office [phone]) or go to [facility] if: You begin to have strong, frequent contractions Your water breaks.  Sometimes it is a big gush of fluid, sometimes it is just a trickle that keeps getting your panties wet or running down your legs You have vaginal bleeding.  It is normal to have a small amount of spotting if your cervix was checked.  You don't feel your baby moving like normal.  If you don't, get you something to eat and drink and lay down and focus on feeling your baby move.   If your baby is still not moving like normal, you should call the office or go to [facility].  Call the office [phone]) or go to [facility] hospital for these signs of pre-eclampsia: Severe headache that does not go away with Tylenol Visual changes- seeing spots, double, blurred vision Pain under your right breast or  upper abdomen that does not go away with Tums or heartburn medicine Nausea and/or vomiting Severe swelling in your hands, feet, and face    [facility] Pediatricians/Family Doctors [facility]): [address] Colette Ribas, [phone]           [facility]: [address], [phone]                [facility]): [address], [phone] (call to ask if accepting patients) [facility] Department: [address] [place], [phone]    [facility] Pediatricians/Family Doctors [facility]): [address], [phone] [facility]: [address] [place], [phone] [facility]: [address], [phone]  [facility] Doctors  [facility]): [phone] [facility]: [address] [phone]   [facility] Doctors [facility]: [address], [phone]  [facility] Doctors  [facility]: [phone], [phone]  Home Blood Pressure Monitoring for Patients   Your provider has recommended that you check your blood pressure (BP) at least once a week at home. If you do not have a blood pressure cuff at home, one will be provided for you. Contact your provider if you have not received your monitor within 1 week.   Helpful Tips for Accurate Home Blood Pressure Checks  Don't smoke, exercise, or drink  caffeine 30 minutes before checking your BP Use the restroom before checking your BP (a full bladder can raise your pressure) Relax in a comfortable upright chair Feet on the ground Left arm resting comfortably on a flat surface at the level of your heart Legs uncrossed Back supported Sit quietly and don't talk Place the cuff on your bare arm Adjust snuggly, so that only two fingertips can fit between your skin and the top of the cuff Check 2  readings separated by at least one minute Keep a log of your BP readings For a visual, please reference this diagram: http://ccnc.care/bpdiagram  Provider Name: [facility]     Phone: [phone]  Zone 1: ALL CLEAR  Continue to monitor your symptoms:  BP reading is less than 140 (top number) or less than 90 (bottom number)  No right upper stomach pain No headaches or seeing spots No feeling nauseated or throwing up No swelling in face and hands  Zone 2: CAUTION Call your doctor's office for any of the following:  BP reading is greater than 140 (top number) or greater than 90 (bottom number)  Stomach pain under your ribs in the middle or right side Headaches or seeing spots Feeling nauseated or throwing up Swelling in face and hands  Zone 3: EMERGENCY  Seek immediate medical care if you have any of the following:  BP reading is greater than160 (top number) or greater than 110 (bottom number) Severe headaches not improving with Tylenol Serious difficulty catching your breath Any worsening symptoms from Zone 2   Second Trimester of Pregnancy The second trimester is from week 13 through week 28, months 4 through 6. The second trimester is often a time when you feel your best. Your body has also adjusted to being pregnant, and you begin to feel better physically. Usually, morning sickness has lessened or quit completely, you may have more energy, and you may have an increase in appetite. The second trimester is also a time when the fetus is growing rapidly. At the end of the sixth month, the fetus is about 9 inches long and weighs about 1 pounds. You will likely begin to feel the baby move (quickening) between 18 and 20 weeks of the pregnancy. BODY CHANGES Your body goes through many changes during pregnancy. The changes vary from woman to woman.  Your weight will continue to increase. You will notice your lower abdomen bulging out. You may begin to get stretch marks on your  hips, abdomen, and breasts. You may develop headaches that can be relieved by medicines approved by your health care provider. You may urinate more often because the fetus is pressing on your bladder. You may develop or continue to have heartburn as a result of your pregnancy. You may develop constipation because certain hormones are causing the muscles that push waste through your intestines to slow down. You may develop hemorrhoids or swollen, bulging veins (varicose veins). You may have back pain because of the weight gain and pregnancy hormones relaxing your joints between the bones in your pelvis and as a result of a shift in weight and the muscles that support your balance. Your breasts will continue to grow and be tender. Your gums may bleed and may be sensitive to brushing and flossing. Dark spots or blotches (chloasma, mask of pregnancy) may develop on your face. This will likely fade after the baby is born. A dark line from your belly button to the pubic area (linea nigra) may appear. This  will likely fade after the baby is born. You may have changes in your hair. These can include thickening of your hair, rapid growth, and changes in texture. Some women also have hair loss during or after pregnancy, or hair that feels dry or thin. Your hair will most likely return to normal after your baby is born. WHAT TO EXPECT AT YOUR PRENATAL VISITS During a routine prenatal visit: You will be weighed to make sure you and the fetus are growing normally. Your blood pressure will be taken. Your abdomen will be measured to track your baby's growth. The fetal heartbeat will be listened to. Any test results from the previous visit will be discussed. Your health care provider may ask you: How you are feeling. If you are feeling the baby move. If you have had any abnormal symptoms, such as leaking fluid, bleeding, severe headaches, or abdominal cramping. If you have any questions. Other tests that may  be performed during your second trimester include: Blood tests that check for: Low iron levels (anemia). Gestational diabetes (between 24 and 28 weeks). Rh antibodies. Urine tests to check for infections, diabetes, or protein in the urine. An ultrasound to confirm the proper growth and development of the baby. An amniocentesis to check for possible genetic problems. Fetal screens for spina bifida and Down syndrome. HOME CARE INSTRUCTIONS  Avoid all smoking, herbs, alcohol, and unprescribed drugs. These chemicals affect the formation and growth of the baby. Follow your health care provider's instructions regarding medicine use. There are medicines that are either safe or unsafe to take during pregnancy. Exercise only as directed by your health care provider. Experiencing uterine cramps is a good sign to stop exercising. Continue to eat regular, healthy meals. Wear a good support bra for breast tenderness. Do not use hot tubs, steam rooms, or saunas. Wear your seat belt at all times when driving. Avoid raw meat, uncooked cheese, cat litter boxes, and soil used by cats. These carry germs that can cause birth defects in the baby. Take your prenatal vitamins. Try taking a stool softener (if your health care provider approves) if you develop constipation. Eat more high-fiber foods, such as fresh vegetables or fruit and whole grains. Drink plenty of fluids to keep your urine clear or pale yellow. Take warm sitz baths to soothe any pain or discomfort caused by hemorrhoids. Use hemorrhoid cream if your health care provider approves. If you develop varicose veins, wear support hose. Elevate your feet for 15 minutes, 3-4 times a day. Limit salt in your diet. Avoid heavy lifting, wear low heel shoes, and practice good posture. Rest with your legs elevated if you have leg cramps or low back pain. Visit your dentist if you have not gone yet during your pregnancy. Use a soft toothbrush to brush your teeth  and be gentle when you floss. A sexual relationship may be continued unless your health care provider directs you otherwise. Continue to go to all your prenatal visits as directed by your health care provider. SEEK MEDICAL CARE IF:  You have dizziness. You have mild pelvic cramps, pelvic pressure, or nagging pain in the abdominal area. You have persistent nausea, vomiting, or diarrhea. You have a bad smelling vaginal discharge. You have pain with urination. SEEK IMMEDIATE MEDICAL CARE IF:  You have a fever. You are leaking fluid from your vagina. You have spotting or bleeding from your vagina. You have severe abdominal cramping or pain. You have rapid weight gain or loss. You have shortness of   breath with chest pain. You notice sudden or extreme swelling of your face, hands, ankles, feet, or legs. You have not felt your baby move in over an hour. You have severe headaches that do not go away with medicine. You have vision changes. Document Released: 06/21/2001 Document Revised: 07/02/2013 Document Reviewed: 08/28/2012 [facility] Patient Information 2015 [place], [place]. This information is not intended to replace advice given to you by your health care provider. Make sure you discuss any questions you have with your health care provider.

## 2021-06-17 NOTE — Progress Notes (Addendum)
    LOW-RISK PREGNANCY VISIT Patient name: Beverly Hopkins MRN [id number]  Date of birth: July 31, 2000 Chief Complaint:   Routine Prenatal Visit  History of Present Illness:   Beverly Hopkins is a 20 y.o. G1P0 female at [redacted]w[redacted]d with an Estimated Date of Delivery: 10/08/21 being seen today for ongoing management of a low-risk pregnancy.   Today she reports  fluid in ears, no pain . Contractions: Not present. Vag. Bleeding: None.   . denies leaking of fluid.  Depression screen [facility] 2/9 03/25/2021  Decreased Interest 0  Down, Depressed, Hopeless 0  PHQ - 2 Score 0  Altered sleeping 1  Tired, decreased energy 0  Change in appetite 0  Feeling bad or failure about yourself  0  Trouble concentrating 0  Moving slowly or fidgety/restless 0  Suicidal thoughts 0  PHQ-9 Score 1     GAD 7 : Generalized Anxiety Score 03/25/2021  Nervous, Anxious, on Edge 0  Control/stop worrying 0  Worry too much - different things 0  Trouble relaxing 0  Restless 0  Easily annoyed or irritable 1  Afraid - awful might happen 0  Total GAD 7 Score 1      Review of Systems:   Pertinent items are noted in HPI Denies abnormal vaginal discharge w/ itching/odor/irritation, headaches, visual changes, shortness of breath, chest pain, abdominal pain, severe nausea/vomiting, or problems with urination or bowel movements unless otherwise stated above. Pertinent History Reviewed:  Reviewed past medical,surgical, social, obstetrical and family history.  Reviewed problem list, medications and allergies. Physical Assessment:   Vitals:   06/17/21 0845  BP: 109/73  Pulse: 97  Weight: 126 lb (57.2 kg)  There is no height or weight on file to calculate BMI.        Physical Examination:   General appearance: Well appearing, and in no distress  Mental status: Alert, oriented to person, place, and time  Skin: Warm & dry  Cardiovascular: Normal heart rate noted  Respiratory: Normal respiratory effort, no  distress  Abdomen: Soft, gravid, nontender  Pelvic: Cervical exam deferred         Extremities: Edema: None  Fetal Status: Fetal Heart Rate (bpm): 148 Fundal Height: 23 cm      Chaperone: N/A   No results found for this or any previous visit (from the past 24 hour(s)).  Assessment & Plan:  1) Low-risk pregnancy G1P0 at [redacted]w[redacted]d with an Estimated Date of Delivery: 10/08/21   2) Fluid in ears> can try decongestant   Meds: No orders of the defined types were placed in this encounter.  Labs/procedures today: none  Plan:  Continue routine obstetrical care  Next visit: prefers will be in person for pn2     Reviewed: Preterm labor symptoms and general obstetric precautions including but not limited to vaginal bleeding, contractions, leaking of fluid and fetal movement were reviewed in detail with the patient.  All questions were answered. Does have home bp cuff. Office bp cuff given: not applicable. Check bp weekly, let us know if consistently >140 and/or >90.  Follow-up: Return in about 4 weeks (around 07/15/2021) for LROB, PN2, CNM, in person.  Future Appointments  Date Time Provider Department Center  07/16/2021  8:50 AM [facility]  07/16/2021  9:10 AM Eure, Amaryllis Dyke, MD [facility]    No orders of the defined types were placed in this encounter.  Cheral Marker CNM, [facility] 06/17/2021 9:17 AM

## 2021-07-11 NOTE — L&D Delivery Note (Signed)
OB/GYN Faculty Practice Delivery Note ? ?Beverly Hopkins is a 21 y.o. G1P0 s/p VD at [redacted]w[redacted]d. She was admitted for IOL due to pre-eclampsia with severe features (HA+BP).  ? ?ROM: 22h 53m with clear fluid ?GBS Status: Negative/-- (03/10 1400) ?Maximum Maternal Temperature: 98.68F ? ?Labor Progress: ?Initial SVE: 0.5/70/-2. She then progressed to complete with assistance of cytotec and pit.  ? ?Delivery Date/Time: 3/22, at 1544 ?Delivery: Called to room and patient was complete and pushing. She pushed for approximately 1.5 hours. Head delivered L OA. Loose nuchal cord present and delivered through. Encountered difficulty with delivering the remainder of body, however noted to be in the setting of a posterior compound arm. Able to deliver the posterior arm after approximately one minute with gentle downward traction to release the anterior shoulder and hooking on to the posterior axilla with mild rotation of the thorax towards the maternal right. Infant with poor tone, placed on mother's abdomen, dried and stimulated. Cord clamped x 2 immediately, and cut by provider. Infant taken for evaluation to NICU team. Cord blood drawn. Cord CBG 7.3. Placenta delivered spontaneously with gentle cord traction. Fundus firm with massage and Pitocin. Prophylactic TXA given. Labia, perineum, vagina, and cervix inspected with a small peri-urethral and vaginal wall tear that were both hemostatic and not repaired.  ?Baby Weight: pending ? ?Placenta: 3 vessel, intact. Sent to pathology.  ?Complications: None ?Lacerations: Small peri-urethral and vaginal wall abrasions  ?EBL: 200 mL ?Analgesia: Epidural  ? ?Infant:  ?APGAR (1 MIN): 6   ?APGAR (5 MINS): 9   ? ?Darrelyn Hillock, DO  ?[facility] Family Medicine Fellow, Faculty Practice ?Center for [place] ?09/29/2021, 4:17 PM ? ? ? ?  ?

## 2021-07-16 ENCOUNTER — Encounter: Payer: Self-pay | Admitting: Obstetrics & Gynecology

## 2021-07-16 ENCOUNTER — Other Ambulatory Visit: Payer: Self-pay

## 2021-07-16 ENCOUNTER — Other Ambulatory Visit: Payer: Medicaid Other

## 2021-07-16 ENCOUNTER — Ambulatory Visit (INDEPENDENT_AMBULATORY_CARE_PROVIDER_SITE_OTHER): Payer: Medicaid Other | Admitting: Obstetrics & Gynecology

## 2021-07-16 VITALS — BP 109/74 | HR 98 | Wt 137.5 lb

## 2021-07-16 DIAGNOSIS — Z23 Encounter for immunization: Secondary | ICD-10-CM | POA: Diagnosis not present

## 2021-07-16 DIAGNOSIS — Z3A28 28 weeks gestation of pregnancy: Secondary | ICD-10-CM

## 2021-07-16 DIAGNOSIS — Z3403 Encounter for supervision of normal first pregnancy, third trimester: Secondary | ICD-10-CM

## 2021-07-16 DIAGNOSIS — Z131 Encounter for screening for diabetes mellitus: Secondary | ICD-10-CM

## 2021-07-16 NOTE — Progress Notes (Signed)
° °  LOW-RISK PREGNANCY VISIT Patient name: Beverly Hopkins MRN [id number]  Date of birth: 2000-09-06 Chief Complaint:   Routine Prenatal Visit (PN2 today)  History of Present Illness:   Beverly Hopkins is a 21 y.o. G1P0 female at [redacted]w[redacted]d with an Estimated Date of Delivery: 10/08/21 being seen today for ongoing management of a low-risk pregnancy.  Depression screen [facility] 2/9 07/16/2021 03/25/2021  Decreased Interest 0 0  Down, Depressed, Hopeless 0 0  PHQ - 2 Score 0 0  Altered sleeping 0 1  Tired, decreased energy 0 0  Change in appetite 0 0  Feeling bad or failure about yourself  0 0  Trouble concentrating 0 0  Moving slowly or fidgety/restless 0 0  Suicidal thoughts 0 0  PHQ-9 Score 0 1    Today she reports no complaints. Contractions: Not present. Vag. Bleeding: None.  Movement: Present. denies leaking of fluid. Review of Systems:   Pertinent items are noted in HPI Denies abnormal vaginal discharge w/ itching/odor/irritation, headaches, visual changes, shortness of breath, chest pain, abdominal pain, severe nausea/vomiting, or problems with urination or bowel movements unless otherwise stated above. Pertinent History Reviewed:  Reviewed past medical,surgical, social, obstetrical and family history.  Reviewed problem list, medications and allergies. Physical Assessment:   Vitals:   07/16/21 0850  BP: 109/74  Pulse: 98  Weight: 137 lb 8 oz (62.4 kg)  There is no height or weight on file to calculate BMI.        Physical Examination:   General appearance: Well appearing, and in no distress  Mental status: Alert, oriented to person, place, and time  Skin: Warm & dry  Cardiovascular: Normal heart rate noted  Respiratory: Normal respiratory effort, no distress  Abdomen: Soft, gravid, nontender  Pelvic: Cervical exam deferred         Extremities: Edema: None  Fetal Status:     Movement: Present    Chaperone: n/a    No results found for this or any previous visit (from the past  24 hour(s)).  Assessment & Plan:  1) Low-risk pregnancy G1P0 at [redacted]w[redacted]d with an Estimated Date of Delivery: 10/08/21      Meds: No orders of the defined types were placed in this encounter.  Labs/procedures today: PN2  Plan:  Continue routine obstetrical care  Next visit: prefers in person    Reviewed: Preterm labor symptoms and general obstetric precautions including but not limited to vaginal bleeding, contractions, leaking of fluid and fetal movement were reviewed in detail with the patient.  All questions were answered. Has home bp cuff. Rx faxed to . Check bp weekly, let us know if >140/90.   Follow-up: Return in about 3 weeks (around 08/06/2021) for LROB.  Orders Placed This Encounter  Procedures   Tdap vaccine greater than or equal to 7yo IM    Lazaro Arms, MD 07/16/2021 9:39 AM

## 2021-07-17 LAB — CBC
Hematocrit: 32.1 % — ABNORMAL LOW (ref 34.0–46.6)
Hemoglobin: 10.6 g/dL — ABNORMAL LOW (ref 11.1–15.9)
MCH: 29 pg (ref 26.6–33.0)
MCHC: 33 g/dL (ref 31.5–35.7)
MCV: 88 fL (ref 79–97)
Platelets: 314 10*3/uL (ref 150–450)
RBC: 3.66 x10E6/uL — ABNORMAL LOW (ref 3.77–5.28)
RDW: 11.9 % (ref 11.7–15.4)
WBC: 9.6 10*3/uL (ref 3.4–10.8)

## 2021-07-17 LAB — GLUCOSE TOLERANCE, 2 HOURS W/ 1HR
Glucose, 1 hour: 160 mg/dL (ref 70–179)
Glucose, 2 hour: 149 mg/dL (ref 70–152)
Glucose, Fasting: 82 mg/dL (ref 70–91)

## 2021-07-17 LAB — RPR: RPR Ser Ql: NONREACTIVE

## 2021-07-17 LAB — HIV ANTIBODY (ROUTINE TESTING W REFLEX): HIV Screen 4th Generation wRfx: NONREACTIVE

## 2021-07-17 LAB — ANTIBODY SCREEN: Antibody Screen: NEGATIVE

## 2021-07-19 ENCOUNTER — Other Ambulatory Visit: Payer: Self-pay | Admitting: Women's Health

## 2021-07-19 MED ORDER — FERROUS SULFATE 325 (65 FE) MG PO TABS: 325.0000 mg | ORAL_TABLET | ORAL | 2 refills | Status: AC

## 2021-08-05 ENCOUNTER — Other Ambulatory Visit: Payer: Self-pay

## 2021-08-05 ENCOUNTER — Ambulatory Visit (INDEPENDENT_AMBULATORY_CARE_PROVIDER_SITE_OTHER): Payer: Medicaid Other | Admitting: Obstetrics & Gynecology

## 2021-08-05 ENCOUNTER — Encounter: Payer: Self-pay | Admitting: Obstetrics & Gynecology

## 2021-08-05 VITALS — BP 110/76 | HR 102 | Wt 138.0 lb

## 2021-08-05 DIAGNOSIS — Z3403 Encounter for supervision of normal first pregnancy, third trimester: Secondary | ICD-10-CM

## 2021-08-05 NOTE — Progress Notes (Signed)
° °  LOW-RISK PREGNANCY VISIT Patient name: Beverly Hopkins MRN [id number]  Date of birth: 03/27/2001 Chief Complaint:   Routine Prenatal Visit  History of Present Illness:   Beverly Hopkins is a 21 y.o. G1P0 female at [redacted]w[redacted]d with an Estimated Date of Delivery: 10/08/21 being seen today for ongoing management of a low-risk pregnancy.   Depression screen [facility] 2/9 07/16/2021 03/25/2021  Decreased Interest 0 0  Down, Depressed, Hopeless 0 0  PHQ - 2 Score 0 0  Altered sleeping 0 1  Tired, decreased energy 0 0  Change in appetite 0 0  Feeling bad or failure about yourself  0 0  Trouble concentrating 0 0  Moving slowly or fidgety/restless 0 0  Suicidal thoughts 0 0  PHQ-9 Score 0 1    Today she reports no complaints. Contractions: Not present. Vag. Bleeding: None.  Movement: Present. denies leaking of fluid. Review of Systems:   Pertinent items are noted in HPI Denies abnormal vaginal discharge w/ itching/odor/irritation, headaches, visual changes, shortness of breath, chest pain, abdominal pain, severe nausea/vomiting, or problems with urination or bowel movements unless otherwise stated above. Pertinent History Reviewed:  Reviewed past medical,surgical, social, obstetrical and family history.  Reviewed problem list, medications and allergies.  Physical Assessment:   Vitals:   08/05/21 0856  BP: 110/76  Pulse: (!) 102  Weight: 138 lb (62.6 kg)  There is no height or weight on file to calculate BMI.        Physical Examination:   General appearance: Well appearing, and in no distress  Mental status: Alert, oriented to person, place, and time  Skin: Warm & dry  Respiratory: Normal respiratory effort, no distress  Abdomen: Soft, gravid, nontender  Pelvic: Cervical exam deferred         Extremities: Edema: None  Psych:  mood and affect appropriate  Fetal Status: Fetal Heart Rate (bpm): 135 Fundal Height: 30 cm Movement: Present    Chaperone: n/a    No results found for this or  any previous visit (from the past 24 hour(s)).   Assessment & Plan:  1) Low-risk pregnancy G1P0 at [redacted]w[redacted]d with an Estimated Date of Delivery: 10/08/21     Meds: No orders of the defined types were placed in this encounter.  Labs/procedures today: doppler  Plan:  Continue routine obstetrical care  Next visit: prefers in person    Reviewed: Preterm labor symptoms and general obstetric precautions including but not limited to vaginal bleeding, contractions, leaking of fluid and fetal movement were reviewed in detail with the patient.  All questions were answered. Pt has home bp cuff. Check bp weekly, let us know if >140/90.   Follow-up: Return in about 2 weeks (around 08/19/2021) for LROB visit.  No orders of the defined types were placed in this encounter.   Myna Hidalgo, DO Attending Obstetrician & Gynecologist, [facility] for [employer], [facility]

## 2021-08-19 ENCOUNTER — Ambulatory Visit (INDEPENDENT_AMBULATORY_CARE_PROVIDER_SITE_OTHER): Payer: Medicaid Other | Admitting: Advanced Practice Midwife

## 2021-08-19 ENCOUNTER — Encounter: Payer: Self-pay | Admitting: Advanced Practice Midwife

## 2021-08-19 ENCOUNTER — Other Ambulatory Visit: Payer: Self-pay

## 2021-08-19 VITALS — BP 103/78 | HR 130 | Wt 142.0 lb

## 2021-08-19 DIAGNOSIS — Z3A32 32 weeks gestation of pregnancy: Secondary | ICD-10-CM

## 2021-08-19 DIAGNOSIS — Z3403 Encounter for supervision of normal first pregnancy, third trimester: Secondary | ICD-10-CM

## 2021-08-19 NOTE — Progress Notes (Signed)
° °  LOW-RISK PREGNANCY VISIT Patient name: Beverly Hopkins MRN [id number]  Date of birth: 09/20/2000 Chief Complaint:   Routine Prenatal Visit  History of Present Illness:   Beverly Hopkins is a 21 y.o. G1P0 female at [redacted]w[redacted]d with an Estimated Date of Delivery: 10/08/21 being seen today for ongoing management of a low-risk pregnancy.  Today she reports normal pregnancy complaints.  Doesn't feel like her heart is racing. Recheck of pulse+ ~ 100 BPM. Contractions: Not present. Vag. Bleeding: None.  Movement: Present. denies leaking of fluid. Review of Systems:   Pertinent items are noted in HPI Denies abnormal vaginal discharge w/ itching/odor/irritation, headaches, visual changes, shortness of breath, chest pain, abdominal pain, severe nausea/vomiting, or problems with urination or bowel movements unless otherwise stated above. Pertinent History Reviewed:  Reviewed past medical,surgical, social, obstetrical and family history.  Reviewed problem list, medications and allergies. Physical Assessment:   Vitals:   08/19/21 0954  BP: 103/78  Pulse: (!) 130  Weight: 142 lb (64.4 kg)  There is no height or weight on file to calculate BMI.        Physical Examination:   General appearance: Well appearing, and in no distress  Mental status: Alert, oriented to person, place, and time  Skin: Warm & dry  Cardiovascular: Normal heart rate noted  Respiratory: Normal respiratory effort, no distress  Abdomen: Soft, gravid, nontender  Pelvic: Cervical exam deferred         Extremities: Edema: None  Fetal Status: Fetal Heart Rate (bpm): 142 Fundal Height: 32 cm Movement: Present    Chaperone: n/a    No results found for this or any previous visit (from the past 24 hour(s)).  Assessment & Plan:  1) Low-risk pregnancy G1P0 at [redacted]w[redacted]d with an Estimated Date of Delivery: 10/08/21     Meds: No orders of the defined types were placed in this encounter.  Labs/procedures today: none  Plan:  Continue  routine obstetrical care  Next visit: prefers in person    Reviewed: Preterm labor symptoms and general obstetric precautions including but not limited to vaginal bleeding, contractions, leaking of fluid and fetal movement were reviewed in detail with the patient.  All questions were answered. Has home bp cuff.. Check bp weekly, let us know if >140/90.   Follow-up: Return in about 2 weeks (around 09/02/2021) for LROB.  No orders of the defined types were placed in this encounter.  Jacklyn Shell DNP, CNM 08/19/2021 10:25 AM

## 2021-08-19 NOTE — Patient Instructions (Signed)
Beverly Hopkins, I greatly value your feedback.  If you receive a survey following your visit with [place] today, we appreciate you taking the time to fill it out.  Thanks, Beverly Hopkins, CNM   [facility] HAS MOVED!!! It is now [facility] ([address] [place], [place] [address]) Entrance located off of [address] Free 24/7 valet parking   Go to [email or web address] to register for FREE online childbirth classes    Call the office [phone]) or go to [facility] if: You begin to have strong, frequent contractions Your water breaks.  Sometimes it is a big gush of fluid, sometimes it is just a trickle that keeps getting your panties wet or running down your legs You have vaginal bleeding.  It is normal to have a small amount of spotting if your cervix was checked.  You don't feel your baby moving like normal.  If you don't, get you something to eat and drink and lay down and focus on feeling your baby move.  You should feel at least 10 movements in 2 hours.  If you don't, you should call the office or go to [facility].    Tdap Vaccine It is recommended that you get the Tdap vaccine during the third trimester of EACH pregnancy to help protect your baby from getting pertussis (whooping cough) 27-36 weeks is the BEST time to do this so that you can pass the protection on to your baby. During pregnancy is better than after pregnancy, but if you are unable to get it during pregnancy it will be offered at the hospital.  You will be offered this vaccine in the office after 27 weeks. If you do not have health insurance, you can get this vaccine at the health department or your family doctor Everyone who will be around your baby should also be up-to-date on their vaccines. Adults (who are not pregnant) only need 1 dose of Tdap during adulthood.   Third Trimester of Pregnancy The third trimester is from week 29 through week 42, months 7 through 9. The third  trimester is a time when the fetus is growing rapidly. At the end of the ninth month, the fetus is about 20 inches in length and weighs 6-10 pounds.  BODY CHANGES Your body goes through many changes during pregnancy. The changes vary from woman to woman.  Your weight will continue to increase. You can expect to gain 25-35 pounds (11-16 kg) by the end of the pregnancy. You may begin to get stretch marks on your hips, abdomen, and breasts. You may urinate more often because the fetus is moving lower into your pelvis and pressing on your bladder. You may develop or continue to have heartburn as a result of your pregnancy. You may develop constipation because certain hormones are causing the muscles that push waste through your intestines to slow down. You may develop hemorrhoids or swollen, bulging veins (varicose veins). You may have pelvic pain because of the weight gain and pregnancy hormones relaxing your joints between the bones in your pelvis. Backaches may result from overexertion of the muscles supporting your posture. You may have changes in your hair. These can include thickening of your hair, rapid growth, and changes in texture. Some women also have hair loss during or after pregnancy, or hair that feels dry or thin. Your hair will most likely return to normal after your baby is born. Your breasts will continue to grow and be tender. A yellow  discharge may leak from your breasts called colostrum. Your belly button may stick out. You may feel short of breath because of your expanding uterus. You may notice the fetus "dropping," or moving lower in your abdomen. You may have a bloody mucus discharge. This usually occurs a few days to a week before labor begins. Your cervix becomes thin and soft (effaced) near your due date. WHAT TO EXPECT AT YOUR PRENATAL EXAMS  You will have prenatal exams every 2 weeks until week 36. Then, you will have weekly prenatal exams. During a routine prenatal  visit: You will be weighed to make sure you and the fetus are growing normally. Your blood pressure is taken. Your abdomen will be measured to track your baby's growth. The fetal heartbeat will be listened to. Any test results from the previous visit will be discussed. You may have a cervical check near your due date to see if you have effaced. At around 36 weeks, your caregiver will check your cervix. At the same time, your caregiver will also perform a test on the secretions of the vaginal tissue. This test is to determine if a type of bacteria, Group B streptococcus, is present. Your caregiver will explain this further. Your caregiver may ask you: What your birth plan is. How you are feeling. If you are feeling the baby move. If you have had any abnormal symptoms, such as leaking fluid, bleeding, severe headaches, or abdominal cramping. If you have any questions. Other tests or screenings that may be performed during your third trimester include: Blood tests that check for low iron levels (anemia). Fetal testing to check the health, activity level, and growth of the fetus. Testing is done if you have certain medical conditions or if there are problems during the pregnancy. FALSE LABOR You may feel small, irregular contractions that eventually go away. These are called Braxton Hicks contractions, or false labor. Contractions may last for hours, days, or even weeks before true labor sets in. If contractions come at regular intervals, intensify, or become painful, it is best to be seen by your caregiver.  SIGNS OF LABOR  Menstrual-like cramps. Contractions that are 5 minutes apart or less. Contractions that start on the top of the uterus and spread down to the lower abdomen and back. A sense of increased pelvic pressure or back pain. A watery or bloody mucus discharge that comes from the vagina. If you have any of these signs before the 37th week of pregnancy, call your caregiver right away.  You need to go to the hospital to get checked immediately. HOME CARE INSTRUCTIONS  Avoid all smoking, herbs, alcohol, and unprescribed drugs. These chemicals affect the formation and growth of the baby. Follow your caregiver's instructions regarding medicine use. There are medicines that are either safe or unsafe to take during pregnancy. Exercise only as directed by your caregiver. Experiencing uterine cramps is a good sign to stop exercising. Continue to eat regular, healthy meals. Wear a good support bra for breast tenderness. Do not use hot tubs, steam rooms, or saunas. Wear your seat belt at all times when driving. Avoid raw meat, uncooked cheese, cat litter boxes, and soil used by cats. These carry germs that can cause birth defects in the baby. Take your prenatal vitamins. Try taking a stool softener (if your caregiver approves) if you develop constipation. Eat more high-fiber foods, such as fresh vegetables or fruit and whole grains. Drink plenty of fluids to keep your urine clear or pale yellow. Take  warm sitz baths to soothe any pain or discomfort caused by hemorrhoids. Use hemorrhoid cream if your caregiver approves. If you develop varicose veins, wear support hose. Elevate your feet for 15 minutes, 3-4 times a day. Limit salt in your diet. Avoid heavy lifting, wear low heal shoes, and practice good posture. Rest a lot with your legs elevated if you have leg cramps or low back pain. Visit your dentist if you have not gone during your pregnancy. Use a soft toothbrush to brush your teeth and be gentle when you floss. A sexual relationship may be continued unless your caregiver directs you otherwise. Do not travel far distances unless it is absolutely necessary and only with the approval of your caregiver. Take prenatal classes to understand, practice, and ask questions about the labor and delivery. Make a trial run to the hospital. Pack your hospital bag. Prepare the baby's  nursery. Continue to go to all your prenatal visits as directed by your caregiver. SEEK MEDICAL CARE IF: You are unsure if you are in labor or if your water has broken. You have dizziness. You have mild pelvic cramps, pelvic pressure, or nagging pain in your abdominal area. You have persistent nausea, vomiting, or diarrhea. You have a bad smelling vaginal discharge. You have pain with urination. SEEK IMMEDIATE MEDICAL CARE IF:  You have a fever. You are leaking fluid from your vagina. You have spotting or bleeding from your vagina. You have severe abdominal cramping or pain. You have rapid weight loss or gain. You have shortness of breath with chest pain. You notice sudden or extreme swelling of your face, hands, ankles, feet, or legs. You have not felt your baby move in over an hour. You have severe headaches that do not go away with medicine. You have vision changes. Document Released: 06/21/2001 Document Revised: 07/02/2013 Document Reviewed: 08/28/2012 [facility] Patient Information 2015 [place], [place]. This information is not intended to replace advice given to you by your health care provider. Make sure you discuss any questions you have with your health care provider.

## 2021-09-02 ENCOUNTER — Other Ambulatory Visit: Payer: Self-pay

## 2021-09-02 ENCOUNTER — Ambulatory Visit (INDEPENDENT_AMBULATORY_CARE_PROVIDER_SITE_OTHER): Payer: Medicaid Other | Admitting: Advanced Practice Midwife

## 2021-09-02 ENCOUNTER — Encounter: Payer: Self-pay | Admitting: Advanced Practice Midwife

## 2021-09-02 VITALS — BP 109/77 | HR 75 | Wt 147.0 lb

## 2021-09-02 DIAGNOSIS — Z3A34 34 weeks gestation of pregnancy: Secondary | ICD-10-CM

## 2021-09-02 DIAGNOSIS — Z3403 Encounter for supervision of normal first pregnancy, third trimester: Secondary | ICD-10-CM

## 2021-09-02 NOTE — Progress Notes (Signed)
° °  LOW-RISK PREGNANCY VISIT Patient name: Beverly Hopkins MRN [id number]  Date of birth: Mar 30, 2001 Chief Complaint:   Routine Prenatal Visit  History of Present Illness:   Beverly Hopkins is a 21 y.o. G1P0 female at [redacted]w[redacted]d with an Estimated Date of Delivery: 10/08/21 being seen today for ongoing management of a low-risk pregnancy.  Today she reports some swelling after working.  BP normal. . Contractions: Irritability. Vag. Bleeding: None.  Movement: Present. denies leaking of fluid. Review of Systems:   Pertinent items are noted in HPI Denies abnormal vaginal discharge w/ itching/odor/irritation, headaches, visual changes, shortness of breath, chest pain, abdominal pain, severe nausea/vomiting, or problems with urination or bowel movements unless otherwise stated above. Pertinent History Reviewed:  Reviewed past medical,surgical, social, obstetrical and family history.  Reviewed problem list, medications and allergies. Physical Assessment:   Vitals:   09/02/21 1336  BP: 109/77  Pulse: 75  Weight: 147 lb (66.7 kg)  There is no height or weight on file to calculate BMI.        Physical Examination:   General appearance: Well appearing, and in no distress  Mental status: Alert, oriented to person, place, and time  Skin: Warm & dry  Cardiovascular: Normal heart rate noted  Respiratory: Normal respiratory effort, no distress  Abdomen: Soft, gravid, nontender  Pelvic: Cervical exam deferred         Extremities: Edema: None  Fetal Status: Fetal Heart Rate (bpm): 140 Fundal Height: 34 cm Movement: Present    Chaperone: n/a    No results found for this or any previous visit (from the past 24 hour(s)).  Assessment & Plan:  1) Low-risk pregnancy G1P0 at [redacted]w[redacted]d with an Estimated Date of Delivery: 10/08/21   2) LE swelling, tips given   Meds: No orders of the defined types were placed in this encounter.  Labs/procedures today: none  Plan:  Continue routine obstetrical care  Next  visit: prefers in person    Reviewed: Preterm labor symptoms and general obstetric precautions including but not limited to vaginal bleeding, contractions, leaking of fluid and fetal movement were reviewed in detail with the patient.  All questions were answered. Has home bp cuff.. Check bp weekly, let us know if >140/90.   Follow-up: Return in about 2 weeks (around 09/16/2021) for LROB.  No orders of the defined types were placed in this encounter.  Jacklyn Shell DNP, CNM 09/02/2021 1:58 PM

## 2021-09-02 NOTE — Patient Instructions (Signed)

## 2021-09-07 ENCOUNTER — Encounter: Payer: Self-pay | Admitting: Advanced Practice Midwife

## 2021-09-17 ENCOUNTER — Ambulatory Visit (INDEPENDENT_AMBULATORY_CARE_PROVIDER_SITE_OTHER): Payer: Medicaid Other | Admitting: Advanced Practice Midwife

## 2021-09-17 ENCOUNTER — Other Ambulatory Visit: Payer: Self-pay

## 2021-09-17 ENCOUNTER — Other Ambulatory Visit (HOSPITAL_COMMUNITY)
Admission: RE | Admit: 2021-09-17 | Discharge: 2021-09-17 | Disposition: A | Discharge status: Home / Self Care | Payer: Medicaid Other | Source: Ambulatory Visit | Attending: Advanced Practice Midwife | Admitting: Advanced Practice Midwife

## 2021-09-17 VITALS — BP 125/86 | HR 82 | Wt 151.4 lb

## 2021-09-17 DIAGNOSIS — Z113 Encounter for screening for infections with a predominantly sexual mode of transmission: Secondary | ICD-10-CM | POA: Diagnosis present

## 2021-09-17 DIAGNOSIS — Z3A37 37 weeks gestation of pregnancy: Secondary | ICD-10-CM | POA: Insufficient documentation

## 2021-09-17 DIAGNOSIS — Z3403 Encounter for supervision of normal first pregnancy, third trimester: Secondary | ICD-10-CM

## 2021-09-17 LAB — OB RESULTS CONSOLE GC/CHLAMYDIA: Gonorrhea: NEGATIVE

## 2021-09-17 NOTE — Progress Notes (Signed)
? ?  LOW-RISK PREGNANCY VISIT ?Patient name: Beverly Hopkins MRN [id number]  Date of birth: 10-06-2000 ?Chief Complaint:   ?No chief complaint on file. ? ?History of Present Illness:   ?Beverly Hopkins is a 21 y.o. G1P0 female at [redacted]w[redacted]d with an Estimated Date of Delivery: 10/08/21 being seen today for ongoing management of a low-risk pregnancy.  ?Today she reports no complaints. Contractions: Irritability. Vag. Bleeding: None.  Movement: Present. denies leaking of fluid. ?Review of Systems:   ?Pertinent items are noted in HPI ?Denies abnormal vaginal discharge w/ itching/odor/irritation, headaches, visual changes, shortness of breath, chest pain, abdominal pain, severe nausea/vomiting, or problems with urination or bowel movements unless otherwise stated above. ?Pertinent History Reviewed:  ?Reviewed past medical,surgical, social, obstetrical and family history.  ?Reviewed problem list, medications and allergies. ?Physical Assessment:  ? ?Vitals:  ? 09/17/21 0924  ?BP: 125/86  ?Pulse: 82  ?Weight: 151 lb 6.4 oz (68.7 kg)  ?There is no height or weight on file to calculate BMI. ?  ?     Physical Examination:  ? General appearance: Well appearing, and in no distress ? Mental status: Alert, oriented to person, place, and time ? Skin: Warm & dry ? Cardiovascular: Normal heart rate noted ? Respiratory: Normal respiratory effort, no distress ? Abdomen: Soft, gravid, nontender ? Pelvic: Cervical exam performed  Dilation: Closed Effacement (%): 50 Station: -3 ? Extremities: Edema: None ? ?Fetal Status: Fetal Heart Rate (bpm): 141 Fundal Height: 38 cm Movement: Present Presentation: Vertex ? ?No results found for this or any previous visit (from the past 24 hour(s)).  ?Assessment & Plan:  ?1) Low-risk pregnancy G1P0 at [redacted]w[redacted]d with an Estimated Date of Delivery: 10/08/21  ? ?  ?Meds: No orders of the defined types were placed in this encounter. ? ?Labs/procedures today: SVE, GBS/cultures ? ?Plan:  Continue routine obstetrical care   ? ?Reviewed: Term labor symptoms and general obstetric precautions including but not limited to vaginal bleeding, contractions, leaking of fluid and fetal movement were reviewed in detail with the patient.  All questions were answered. Has home bp cuff. Check bp weekly, let us know if >140/90.  ? ?Follow-up: Return for LROB, in person, schedule out 1wk visits x 3 weeks. ? ?Orders Placed This Encounter  ?Procedures  ? Culture, beta strep (group b only)  ? ?Arabella Merles CNM ?09/17/2021 ?9:57 AM  ?

## 2021-09-20 ENCOUNTER — Encounter: Payer: Self-pay | Admitting: Advanced Practice Midwife

## 2021-09-20 LAB — CERVICOVAGINAL ANCILLARY ONLY
Chlamydia: NEGATIVE
Comment: NEGATIVE
Comment: NORMAL
Neisseria Gonorrhea: NEGATIVE

## 2021-09-21 LAB — CULTURE, BETA STREP (GROUP B ONLY): Strep Gp B Culture: NEGATIVE

## 2021-09-24 ENCOUNTER — Other Ambulatory Visit: Payer: Self-pay

## 2021-09-24 ENCOUNTER — Encounter: Payer: Self-pay | Admitting: Obstetrics & Gynecology

## 2021-09-24 ENCOUNTER — Ambulatory Visit (INDEPENDENT_AMBULATORY_CARE_PROVIDER_SITE_OTHER): Payer: Medicaid Other | Admitting: Obstetrics & Gynecology

## 2021-09-24 VITALS — BP 132/80 | HR 84 | Wt 154.0 lb

## 2021-09-24 DIAGNOSIS — Z3403 Encounter for supervision of normal first pregnancy, third trimester: Secondary | ICD-10-CM

## 2021-09-24 NOTE — Progress Notes (Signed)
? ?  LOW-RISK PREGNANCY VISIT ?Patient name: Beverly Hopkins MRN [id number]  Date of birth: 29-Jun-2001 ?Chief Complaint:   ?Routine Prenatal Visit ? ?History of Present Illness:   ?Beverly Hopkins is a 21 y.o. G1P0 female at [redacted]w[redacted]d with an Estimated Date of Delivery: 10/08/21 being seen today for ongoing management of a low-risk pregnancy.  ?Depression screen [facility] 2/9 07/16/2021 03/25/2021  ?Decreased Interest 0 0  ?Down, Depressed, Hopeless 0 0  ?PHQ - 2 Score 0 0  ?Altered sleeping 0 1  ?Tired, decreased energy 0 0  ?Change in appetite 0 0  ?Feeling bad or failure about yourself  0 0  ?Trouble concentrating 0 0  ?Moving slowly or fidgety/restless 0 0  ?Suicidal thoughts 0 0  ?PHQ-9 Score 0 1  ? ? ?Today she reports pelvic pressure. Contractions: Irritability. Vag. Bleeding: None.  Movement: Present. denies leaking of fluid. ?Review of Systems:   ?Pertinent items are noted in HPI ?Denies abnormal vaginal discharge w/ itching/odor/irritation, headaches, visual changes, shortness of breath, chest pain, abdominal pain, severe nausea/vomiting, or problems with urination or bowel movements unless otherwise stated above. ?Pertinent History Reviewed:  ?Reviewed past medical,surgical, social, obstetrical and family history.  ?Reviewed problem list, medications and allergies. ?Physical Assessment:  ? ?Vitals:  ? 09/24/21 0838  ?BP: 132/80  ?Pulse: 84  ?Weight: 154 lb (69.9 kg)  ?There is no height or weight on file to calculate BMI. ?  ?     Physical Examination:  ? General appearance: Well appearing, and in no distress ? Mental status: Alert, oriented to person, place, and time ? Skin: Warm & dry ? Cardiovascular: Normal heart rate noted ? Respiratory: Normal respiratory effort, no distress ? Abdomen: Soft, gravid, nontender ? Pelvic: Cervical exam deferred        ? Extremities: Edema: None ? ?Fetal Status: Fetal Heart Rate (bpm): 138 Fundal Height: 38 cm Movement: Present   ? ?Chaperone: n/a   ? ?No results found for this or any  previous visit (from the past 24 hour(s)).  ?Assessment & Plan:  ?1) Low-risk pregnancy G1P0 at [redacted]w[redacted]d with an Estimated Date of Delivery: 10/08/21  ? ? ?  ?Meds: No orders of the defined types were placed in this encounter. ? ?Labs/procedures today:  ? ?Plan:  Continue routine obstetrical care  ?Next visit: prefers in person   ? ?Reviewed: Term labor symptoms and general obstetric precautions including but not limited to vaginal bleeding, contractions, leaking of fluid and fetal movement were reviewed in detail with the patient.  All questions were answered. Has home bp cuff. Rx faxed to . Check bp weekly, let us know if >140/90.  ? ?Follow-up: Return in about 1 week (around 10/01/2021) for LROB. ? ?No orders of the defined types were placed in this encounter. ? ? ?Lazaro Arms, MD ?09/24/2021 ?9:11 AM ? ?

## 2021-09-28 ENCOUNTER — Encounter (HOSPITAL_COMMUNITY): Payer: Self-pay | Admitting: Obstetrics & Gynecology

## 2021-09-28 ENCOUNTER — Other Ambulatory Visit: Payer: Self-pay

## 2021-09-28 ENCOUNTER — Inpatient Hospital Stay (HOSPITAL_COMMUNITY)
Admission: AD | Admit: 2021-09-28 | Discharge: 2021-10-01 | DRG: 806 | Disposition: A | Discharge status: Home / Self Care | Payer: Medicaid Other | Attending: Obstetrics and Gynecology | Admitting: Obstetrics and Gynecology

## 2021-09-28 DIAGNOSIS — R030 Elevated blood-pressure reading, without diagnosis of hypertension: Secondary | ICD-10-CM | POA: Diagnosis present

## 2021-09-28 DIAGNOSIS — Z3A38 38 weeks gestation of pregnancy: Secondary | ICD-10-CM

## 2021-09-28 DIAGNOSIS — R8271 Bacteriuria: Principal | ICD-10-CM

## 2021-09-28 DIAGNOSIS — O99354 Diseases of the nervous system complicating childbirth: Secondary | ICD-10-CM | POA: Diagnosis present

## 2021-09-28 DIAGNOSIS — M419 Scoliosis, unspecified: Secondary | ICD-10-CM | POA: Diagnosis present

## 2021-09-28 DIAGNOSIS — O26833 Pregnancy related renal disease, third trimester: Secondary | ICD-10-CM | POA: Diagnosis not present

## 2021-09-28 DIAGNOSIS — O1414 Severe pre-eclampsia complicating childbirth: Principal | ICD-10-CM | POA: Diagnosis present

## 2021-09-28 DIAGNOSIS — N179 Acute kidney failure, unspecified: Secondary | ICD-10-CM | POA: Diagnosis not present

## 2021-09-28 DIAGNOSIS — Q760 Spina bifida occulta: Secondary | ICD-10-CM | POA: Diagnosis not present

## 2021-09-28 LAB — COMPREHENSIVE METABOLIC PANEL
ALT: 11 U/L (ref 0–44)
AST: 17 U/L (ref 15–41)
Albumin: 2.6 g/dL — ABNORMAL LOW (ref 3.5–5.0)
Alkaline Phosphatase: 159 U/L — ABNORMAL HIGH (ref 38–126)
Anion gap: 10 (ref 5–15)
BUN: 13 mg/dL (ref 6–20)
CO2: 19 mmol/L — ABNORMAL LOW (ref 22–32)
Calcium: 8.9 mg/dL (ref 8.9–10.3)
Chloride: 105 mmol/L (ref 98–111)
Creatinine, Ser: 0.97 mg/dL (ref 0.44–1.00)
GFR, Estimated: >60 mL/min (ref >60)
Glucose, Bld: 80 mg/dL (ref 70–99)
Potassium: 3.9 mmol/L (ref 3.5–5.1)
Sodium: 134 mmol/L — ABNORMAL LOW (ref 135–145)
Total Bilirubin: 0.5 mg/dL (ref 0.3–1.2)
Total Protein: 5.7 g/dL — ABNORMAL LOW (ref 6.5–8.1)

## 2021-09-28 LAB — CBC WITH DIFFERENTIAL/PLATELET
Abs Immature Granulocytes: 0.09 10*3/uL — ABNORMAL HIGH (ref 0.00–0.07)
Basophils Absolute: 0 10*3/uL (ref 0.0–0.1)
Basophils Relative: 0 %
Eosinophils Absolute: 0 10*3/uL (ref 0.0–0.5)
Eosinophils Relative: 0 %
HCT: 36.1 % (ref 36.0–46.0)
Hemoglobin: 12.2 g/dL (ref 12.0–15.0)
Immature Granulocytes: 1 %
Lymphocytes Relative: 9 %
Lymphs Abs: 1.2 10*3/uL (ref 0.7–4.0)
MCH: 29 pg (ref 26.0–34.0)
MCHC: 33.8 g/dL (ref 30.0–36.0)
MCV: 85.7 fL (ref 80.0–100.0)
Monocytes Absolute: 0.7 10*3/uL (ref 0.1–1.0)
Monocytes Relative: 6 %
Neutro Abs: 11.1 10*3/uL — ABNORMAL HIGH (ref 1.7–7.7)
Neutrophils Relative %: 84 %
Platelets: 234 10*3/uL (ref 150–400)
RBC: 4.21 MIL/uL (ref 3.87–5.11)
RDW: 15.4 % (ref 11.5–15.5)
WBC: 13.2 10*3/uL — ABNORMAL HIGH (ref 4.0–10.5)
nRBC: 0 % (ref 0.0–0.2)

## 2021-09-28 LAB — CBC
HCT: 34.2 % — ABNORMAL LOW (ref 36.0–46.0)
Hemoglobin: 11.7 g/dL — ABNORMAL LOW (ref 12.0–15.0)
MCH: 29.3 pg (ref 26.0–34.0)
MCHC: 34.2 g/dL (ref 30.0–36.0)
MCV: 85.7 fL (ref 80.0–100.0)
Platelets: 259 10*3/uL (ref 150–400)
RBC: 3.99 MIL/uL (ref 3.87–5.11)
RDW: 14.9 % (ref 11.5–15.5)
WBC: 13 10*3/uL — ABNORMAL HIGH (ref 4.0–10.5)
nRBC: 0 % (ref 0.0–0.2)

## 2021-09-28 LAB — PROTEIN / CREATININE RATIO, URINE
Creatinine, Urine: 66.6 mg/dL
Protein Creatinine Ratio: 0.9 mg/mg{Cre} — ABNORMAL HIGH (ref 0.00–0.15)
Total Protein, Urine: 60 mg/dL

## 2021-09-28 LAB — TYPE AND SCREEN
ABO/RH(D): A POS
Antibody Screen: NEGATIVE

## 2021-09-28 LAB — RPR: RPR Ser Ql: NONREACTIVE

## 2021-09-28 MED ORDER — FENTANYL CITRATE (PF) 100 MCG/2ML IJ SOLN
100.0000 ug | INTRAMUSCULAR | Status: DC | PRN
  Administered 2021-09-28 (×3): 100 ug via INTRAVENOUS
  Filled 2021-09-28 (×3): qty 2

## 2021-09-28 MED ORDER — EPHEDRINE 5 MG/ML INJ: 10.0000 mg | INTRAVENOUS | Status: DC | PRN

## 2021-09-28 MED ORDER — OXYTOCIN-SODIUM CHLORIDE 30-0.9 UT/500ML-% IV SOLN
1.0000 m[IU]/min | INTRAVENOUS | Status: DC
  Administered 2021-09-28: 2 m[IU]/min via INTRAVENOUS

## 2021-09-28 MED ORDER — LACTATED RINGERS IV SOLN: INTRAVENOUS | Status: DC

## 2021-09-28 MED ORDER — MAGNESIUM SULFATE 40 GM/1000ML IV SOLN
2.0000 g/h | INTRAVENOUS | Status: DC
  Administered 2021-09-28 (×2): 2 g/h via INTRAVENOUS
  Filled 2021-09-28 (×2): qty 1000

## 2021-09-28 MED ORDER — LABETALOL HCL 5 MG/ML IV SOLN: 20.0000 mg | INTRAVENOUS | Status: DC | PRN

## 2021-09-28 MED ORDER — ACETAMINOPHEN 325 MG PO TABS: 650.0000 mg | ORAL_TABLET | ORAL | Status: DC | PRN

## 2021-09-28 MED ORDER — PHENYLEPHRINE 40 MCG/ML (10ML) SYRINGE FOR IV PUSH (FOR BLOOD PRESSURE SUPPORT): 80.0000 ug | PREFILLED_SYRINGE | INTRAVENOUS | Status: DC | PRN

## 2021-09-28 MED ORDER — ONDANSETRON HCL 4 MG/2ML IJ SOLN
4.0000 mg | Freq: Four times a day (QID) | INTRAMUSCULAR | Status: DC | PRN
  Administered 2021-09-28: 4 mg via INTRAVENOUS
  Filled 2021-09-28: qty 2

## 2021-09-28 MED ORDER — LABETALOL HCL 200 MG PO TABS
200.0000 mg | ORAL_TABLET | Freq: Two times a day (BID) | ORAL | Status: DC
  Administered 2021-09-28 (×2): 200 mg via ORAL
  Filled 2021-09-28 (×2): qty 1

## 2021-09-28 MED ORDER — FENTANYL-BUPIVACAINE-NACL 0.5-0.125-0.9 MG/250ML-% EP SOLN
12.0000 mL/h | EPIDURAL | Status: DC | PRN
  Administered 2021-09-29: 10.5 mL/h via EPIDURAL
  Filled 2021-09-28: qty 250

## 2021-09-28 MED ORDER — LACTATED RINGERS IV SOLN
500.0000 mL | Freq: Once | INTRAVENOUS | Status: AC
  Administered 2021-09-29: 500 mL via INTRAVENOUS

## 2021-09-28 MED ORDER — OXYTOCIN BOLUS FROM INFUSION
333.0000 mL | Freq: Once | INTRAVENOUS | Status: AC
  Administered 2021-09-29: 333 mL via INTRAVENOUS

## 2021-09-28 MED ORDER — ACETAMINOPHEN 500 MG PO TABS: 1000.0000 mg | ORAL_TABLET | Freq: Once | ORAL | Status: DC

## 2021-09-28 MED ORDER — DIPHENHYDRAMINE HCL 50 MG/ML IJ SOLN: 12.5000 mg | INTRAMUSCULAR | Status: DC | PRN

## 2021-09-28 MED ORDER — HYDROXYZINE HCL 50 MG PO TABS: 50.0000 mg | ORAL_TABLET | Freq: Four times a day (QID) | ORAL | Status: DC | PRN

## 2021-09-28 MED ORDER — OXYTOCIN-SODIUM CHLORIDE 30-0.9 UT/500ML-% IV SOLN
2.5000 [IU]/h | INTRAVENOUS | Status: DC
  Administered 2021-09-29: 2.5 [IU]/h via INTRAVENOUS
  Filled 2021-09-28 (×2): qty 500

## 2021-09-28 MED ORDER — LIDOCAINE HCL (PF) 1 % IJ SOLN: 30.0000 mL | INTRAMUSCULAR | Status: DC | PRN

## 2021-09-28 MED ORDER — TERBUTALINE SULFATE 1 MG/ML IJ SOLN: 0.2500 mg | Freq: Once | INTRAMUSCULAR | Status: DC | PRN

## 2021-09-28 MED ORDER — SOD CITRATE-CITRIC ACID 500-334 MG/5ML PO SOLN: 30.0000 mL | ORAL | Status: DC | PRN

## 2021-09-28 MED ORDER — LACTATED RINGERS IV SOLN
500.0000 mL | INTRAVENOUS | Status: DC | PRN
  Administered 2021-09-28: 250 mL via INTRAVENOUS

## 2021-09-28 MED ORDER — MAGNESIUM SULFATE BOLUS VIA INFUSION
4.0000 g | Freq: Once | INTRAVENOUS | Status: AC
  Administered 2021-09-28: 4 g via INTRAVENOUS
  Filled 2021-09-28: qty 1000

## 2021-09-28 MED ORDER — OXYCODONE-ACETAMINOPHEN 5-325 MG PO TABS: 1.0000 | ORAL_TABLET | ORAL | Status: DC | PRN

## 2021-09-28 MED ORDER — HYDRALAZINE HCL 20 MG/ML IJ SOLN: 10.0000 mg | INTRAMUSCULAR | Status: DC | PRN

## 2021-09-28 MED ORDER — OXYCODONE-ACETAMINOPHEN 5-325 MG PO TABS: 2.0000 | ORAL_TABLET | ORAL | Status: DC | PRN

## 2021-09-28 MED ORDER — FLEET ENEMA 7-19 GM/118ML RE ENEM: 1.0000 | ENEMA | RECTAL | Status: DC | PRN

## 2021-09-28 MED ORDER — LABETALOL HCL 5 MG/ML IV SOLN: 80.0000 mg | INTRAVENOUS | Status: DC | PRN

## 2021-09-28 MED ORDER — MISOPROSTOL 50MCG HALF TABLET
50.0000 ug | ORAL_TABLET | ORAL | Status: DC | PRN
  Administered 2021-09-28 (×3): 50 ug via BUCCAL
  Filled 2021-09-28 (×3): qty 1

## 2021-09-28 MED ORDER — LABETALOL HCL 5 MG/ML IV SOLN: 40.0000 mg | INTRAVENOUS | Status: DC | PRN

## 2021-09-28 NOTE — Progress Notes (Signed)
Labor Progress Note ?Beverly Hopkins is a 21 y.o. G1P0 at [redacted]w[redacted]d who presented for IOL due to pre-eclampsia with severe features.  ? ?S: Doing well. Reports that her pain has been getting more intense since her water broke. No other concerns at this time.  ? ?O:  ?BP 133/89   Pulse 88   Temp 98 ?F (36.7 ?C) (Oral)   Resp 19   Ht 4\' 11"  (1.499 m)   Wt 72.6 kg   LMP 12/07/2020 (Approximate)   SpO2 99%   BMI 32.32 kg/m?  ? ?EFM: Baseline 120 bpm, moderate variability, + accels, no decels ?Toco: Every 4-5 minutes  ? ?CVE: Dilation: 2.5 ?Effacement (%): 90 ?Cervical Position: Middle ?Station: -1 ?Presentation: Vertex ?Exam by:: Dr. [email or web address] ? ?A&P: 22 y.o. G1P0 [redacted]w[redacted]d  ? ?#Labor: Progressing well. Will start Pitocin 2x2 and reassess in 4 hours.  ?#Pain: IV Fentanyl for now; planning for epidural when ready ?#FWB: Cat 1  ?#GBS negative ? ?#Severe pre-eclampsia: On magnesium. BP normal to mild range. No symptoms currently. Will continue Labetalol 200 mg BID and continue to monitor. Will repeat CBC, CMP, Mg level in AM.  ? ?[redacted]w[redacted]d, MD ?10:47 PM ? ?

## 2021-09-28 NOTE — MAU Note (Signed)
Vertex confirmed via Bedside US performed by Edd Arbour, CNM.  ?

## 2021-09-28 NOTE — Progress Notes (Signed)
Patient ID: Beverly Hopkins, female   DOB: 2001/04/05, 21 y.o.   MRN: [id number] ? ?Patient seen Beverly couple of times throughout the day - doing well. Having cramping with the cytotec.  ? ?BP 129/81   Pulse 88   Temp 98 ?F (36.7 ?C) (Oral)   Resp 16   Wt 72.6 kg   LMP 12/07/2020 (Approximate)   SpO2 99%  ? ?FHT reviewed - Cat 1 tracing. ? ?Dilation: Fingertip ?Effacement (%): 50 ?Cervical Position: Middle ?Station: -2 ?Presentation: Vertex ?Exam by:: Beverly Showfety RN ? ?Last cytotec: 1517 (#3) ?On Magnesium for preeclampsia with severe features - continue. ? ?Levie Heritage, DO ? ?

## 2021-09-28 NOTE — MAU Note (Addendum)
Beverly Hopkins is a 21 y.o. at [redacted]w[redacted]d here in MAU reporting: was awoken from sleep with severe headache she reports she "felt in her teeth". States it has "mellowed out and is just lingering now" Pt states she then went to the store and purchased a BP cuff and took her BP several times in a row. With it increasing each time. With the last BP 188/107. She called her Nurse call line and was advised to come in. Pt denies contractions, cramping, SROM or vaginal bleeding.Pt denies visual changes or epigastric pain. Endorses + fetal movement. ? ? ? ?Vitals:  ? 09/28/21 0506  ?BP: (!) 180/106  ?Pulse: 80  ?Resp: 17  ?Temp: 97.6 ?F (36.4 ?C)  ?SpO2: 100%  ?   ?FHT:150bpm ?Lab orders placed from triage:  UA, protein creatine ratio ? ?Repeat BP: 161/108 ?

## 2021-09-28 NOTE — H&P (Signed)
Beverly Hopkins is a 21 y.o. G1P0 female at [redacted]w[redacted]d presenting for headache and high blood pressure. She woke up this morning with a bad headache ("I could feel it in my teeth") and so took her BP. Highest reading was 188/107 so she came into the hospital. Does not think she's ever had a high reading throughout her pregnancy. ? ?OB History   ? ? Gravida  ?1  ? Para  ?   ? Term  ?   ? Preterm  ?   ? AB  ?   ? Living  ?   ?  ? ? SAB  ?   ? IAB  ?   ? Ectopic  ?   ? Multiple  ?   ? Live Births  ?   ?   ?  ?  ? ?Past Medical History:  ?Diagnosis Date  ? Scoliosis   ? Spina bifida occulta   ? ?Past Surgical History:  ?Procedure Laterality Date  ? CHOANAL ADENIODECTOMY    ? ?Family History: family history includes Spina bifida in her brother. ?Social History:  reports that she has never smoked. She has never used smokeless tobacco. She reports that she does not drink alcohol and does not use drugs. ? ?  ?Maternal Diabetes: No ?Genetic Screening: Normal ?Maternal Ultrasounds/Referrals: Normal ?Fetal Ultrasounds or other Referrals:  None ?Maternal Substance Abuse:  No ?Significant Maternal Medications:  None ?Significant Maternal Lab Results:  Group B Strep negative ?Other Comments:  None ? ?Review of Systems  ?Constitutional:  Negative for fatigue and fever.  ?HENT:  Negative for congestion and sore throat.   ?Eyes:  Negative for photophobia and visual disturbance.  ?Respiratory:  Negative for cough, chest tightness and shortness of breath.   ?Gastrointestinal:  Positive for abdominal pain (mild epigastric). Negative for nausea.  ?Genitourinary:  Negative for vaginal bleeding and vaginal discharge.  ?Musculoskeletal:  Negative for back pain.  ?Neurological:  Positive for headaches. Negative for dizziness, syncope and light-headedness.  ?Psychiatric/Behavioral:  The patient is nervous/anxious (very nervous about giving birth).   ?Maternal Medical History:  ?Reason for admission: Nausea. ? ?Fetal activity: Perceived fetal  activity is normal.   ?Last perceived fetal movement was within the past hour.   ?Prenatal complications: PIH.   ? ?  ?Blood pressure (!) 157/102, pulse 93, temperature 97.6 ?F (36.4 ?C), temperature source Oral, resp. rate 17, weight 160 lb (72.6 kg), last menstrual period 12/07/2020, SpO2 99 %. ?Maternal Exam:  ?Uterine Assessment: Contraction strength is mild.  Contraction duration is 1 minute. Contraction frequency is regular.  ?Abdomen: Patient reports no abdominal tenderness. Fetal presentation: vertex ?Presentation verified by bedside U/S ?Introitus: Normal vulva. Normal vagina.  Pelvis: adequate for delivery.   ? ? ?Fetal Exam ?Fetal Monitor Review: Mode: ultrasound.   ?Baseline rate: 145.  ?Variability: moderate (6-25 bpm).   ?Pattern: accelerations present and no decelerations.   ?Fetal State Assessment: Category I - tracings are normal. ? ?Physical Exam ?Vitals and nursing note reviewed.  ?Constitutional:   ?   General: She is not in acute distress. ?   Appearance: She is well-developed and normal weight. She is not ill-appearing.  ?HENT:  ?   Head: Normocephalic and atraumatic.  ?Eyes:  ?   Extraocular Movements:  ?   Right eye: No nystagmus.  ?   Left eye: No nystagmus.  ?   Pupils: Pupils are equal, round, and reactive to light.  ?Cardiovascular:  ?   Rate and Rhythm: Normal rate  and regular rhythm.  ?Pulmonary:  ?   Effort: Pulmonary effort is normal.  ?Abdominal:  ?   Palpations: Abdomen is soft.  ?   Tenderness: There is no abdominal tenderness.  ?Genitourinary: ?   General: Normal vulva.  ?Musculoskeletal:     ?   General: Normal range of motion.  ?   Cervical back: Normal range of motion.  ?Skin: ?   General: Skin is warm and dry.  ?   Capillary Refill: Capillary refill takes less than 2 seconds.  ?Neurological:  ?   Mental Status: She is alert.  ?Psychiatric:     ?   Mood and Affect: Mood is anxious.     ?   Speech: Speech normal.     ?   Behavior: Behavior normal.  ?  ?Prenatal labs: ?ABO, Rh:  A/Positive/-- (09/15 1004) ?Antibody: Negative (01/06 3220) ?Rubella: 1.29 (09/15 1004) ?RPR: Non Reactive (01/06 2542)  ?HBsAg: Negative (09/15 1004)  ?HIV: Non Reactive (01/06 7062)  ?GBS: Negative/-- (03/10 1400)  ? ?Assessment/Plan: ?G1P0 at [redacted]w[redacted]d ?Severe range pressure on arrival to MAU with all elevated (not severe range) after. ?Admit to L&D for IOL r/t PEC w/SF ?- Report called to L&D resident, admission orders placed ?- IOL to begin with buccal cytotec ?- Cephalic presentation verified via bedside U/S ?PEC: labetalol protocol in and magnesium bolus/infusion ordered ? ?Bernerd Limbo ?09/28/2021, 5:49 AM ? ? ? ? ?

## 2021-09-29 ENCOUNTER — Encounter (HOSPITAL_COMMUNITY): Payer: Self-pay | Admitting: Obstetrics & Gynecology

## 2021-09-29 ENCOUNTER — Inpatient Hospital Stay (HOSPITAL_COMMUNITY): Payer: Medicaid Other | Admitting: Anesthesiology

## 2021-09-29 DIAGNOSIS — Z3A38 38 weeks gestation of pregnancy: Secondary | ICD-10-CM

## 2021-09-29 DIAGNOSIS — N179 Acute kidney failure, unspecified: Secondary | ICD-10-CM

## 2021-09-29 DIAGNOSIS — O1414 Severe pre-eclampsia complicating childbirth: Secondary | ICD-10-CM

## 2021-09-29 LAB — COMPREHENSIVE METABOLIC PANEL
ALT: 12 U/L (ref 0–44)
ALT: 12 U/L (ref 0–44)
ALT: 13 U/L (ref 0–44)
ALT: 13 U/L (ref 0–44)
AST: 22 U/L (ref 15–41)
AST: 22 U/L (ref 15–41)
AST: 22 U/L (ref 15–41)
AST: 25 U/L (ref 15–41)
Albumin: 2.4 g/dL — ABNORMAL LOW (ref 3.5–5.0)
Albumin: 2.4 g/dL — ABNORMAL LOW (ref 3.5–5.0)
Albumin: 2.4 g/dL — ABNORMAL LOW (ref 3.5–5.0)
Albumin: 2.3 g/dL — ABNORMAL LOW (ref 3.5–5.0)
Alkaline Phosphatase: 181 U/L — ABNORMAL HIGH (ref 38–126)
Alkaline Phosphatase: 181 U/L — ABNORMAL HIGH (ref 38–126)
Alkaline Phosphatase: 168 U/L — ABNORMAL HIGH (ref 38–126)
Alkaline Phosphatase: 167 U/L — ABNORMAL HIGH (ref 38–126)
Anion gap: 12 (ref 5–15)
Anion gap: 12 (ref 5–15)
Anion gap: 11 (ref 5–15)
Anion gap: 12 (ref 5–15)
BUN: 12 mg/dL (ref 6–20)
BUN: 13 mg/dL (ref 6–20)
BUN: 15 mg/dL (ref 6–20)
BUN: 17 mg/dL (ref 6–20)
CO2: 17 mmol/L — ABNORMAL LOW (ref 22–32)
CO2: 18 mmol/L — ABNORMAL LOW (ref 22–32)
CO2: 19 mmol/L — ABNORMAL LOW (ref 22–32)
CO2: 18 mmol/L — ABNORMAL LOW (ref 22–32)
Calcium: 8 mg/dL — ABNORMAL LOW (ref 8.9–10.3)
Calcium: 7.8 mg/dL — ABNORMAL LOW (ref 8.9–10.3)
Calcium: 7.8 mg/dL — ABNORMAL LOW (ref 8.9–10.3)
Calcium: 7.6 mg/dL — ABNORMAL LOW (ref 8.9–10.3)
Chloride: 102 mmol/L (ref 98–111)
Chloride: 100 mmol/L (ref 98–111)
Chloride: 102 mmol/L (ref 98–111)
Chloride: 101 mmol/L (ref 98–111)
Creatinine, Ser: 1.35 mg/dL — ABNORMAL HIGH (ref 0.44–1.00)
Creatinine, Ser: 1.47 mg/dL — ABNORMAL HIGH (ref 0.44–1.00)
Creatinine, Ser: 1.93 mg/dL — ABNORMAL HIGH (ref 0.44–1.00)
Creatinine, Ser: 2.25 mg/dL — ABNORMAL HIGH (ref 0.44–1.00)
GFR, Estimated: 58 mL/min — ABNORMAL LOW (ref >60)
GFR, Estimated: 52 mL/min — ABNORMAL LOW (ref >60)
GFR, Estimated: 38 mL/min — ABNORMAL LOW (ref >60)
GFR, Estimated: 31 mL/min — ABNORMAL LOW (ref >60)
Glucose, Bld: 122 mg/dL — ABNORMAL HIGH (ref 70–99)
Glucose, Bld: 120 mg/dL — ABNORMAL HIGH (ref 70–99)
Glucose, Bld: 110 mg/dL — ABNORMAL HIGH (ref 70–99)
Glucose, Bld: 103 mg/dL — ABNORMAL HIGH (ref 70–99)
Potassium: 3.8 mmol/L (ref 3.5–5.1)
Potassium: 3.9 mmol/L (ref 3.5–5.1)
Potassium: 3.7 mmol/L (ref 3.5–5.1)
Potassium: 4 mmol/L (ref 3.5–5.1)
Sodium: 131 mmol/L — ABNORMAL LOW (ref 135–145)
Sodium: 130 mmol/L — ABNORMAL LOW (ref 135–145)
Sodium: 132 mmol/L — ABNORMAL LOW (ref 135–145)
Sodium: 131 mmol/L — ABNORMAL LOW (ref 135–145)
Total Bilirubin: 0.5 mg/dL (ref 0.3–1.2)
Total Bilirubin: 0.5 mg/dL (ref 0.3–1.2)
Total Bilirubin: 0.6 mg/dL (ref 0.3–1.2)
Total Bilirubin: 0.6 mg/dL (ref 0.3–1.2)
Total Protein: 5.7 g/dL — ABNORMAL LOW (ref 6.5–8.1)
Total Protein: 5.8 g/dL — ABNORMAL LOW (ref 6.5–8.1)
Total Protein: 5.9 g/dL — ABNORMAL LOW (ref 6.5–8.1)
Total Protein: 5.5 g/dL — ABNORMAL LOW (ref 6.5–8.1)

## 2021-09-29 LAB — CBC
HCT: 35.8 % — ABNORMAL LOW (ref 36.0–46.0)
HCT: 36.6 % (ref 36.0–46.0)
Hemoglobin: 12 g/dL (ref 12.0–15.0)
Hemoglobin: 11.8 g/dL — ABNORMAL LOW (ref 12.0–15.0)
MCH: 28.8 pg (ref 26.0–34.0)
MCH: 27.7 pg (ref 26.0–34.0)
MCHC: 33.5 g/dL (ref 30.0–36.0)
MCHC: 32.2 g/dL (ref 30.0–36.0)
MCV: 85.9 fL (ref 80.0–100.0)
MCV: 85.9 fL (ref 80.0–100.0)
Platelets: 240 10*3/uL (ref 150–400)
Platelets: 247 10*3/uL (ref 150–400)
RBC: 4.17 MIL/uL (ref 3.87–5.11)
RBC: 4.26 MIL/uL (ref 3.87–5.11)
RDW: 15.4 % (ref 11.5–15.5)
RDW: 15.8 % — ABNORMAL HIGH (ref 11.5–15.5)
WBC: 13 10*3/uL — ABNORMAL HIGH (ref 4.0–10.5)
WBC: 14.9 10*3/uL — ABNORMAL HIGH (ref 4.0–10.5)
nRBC: 0 % (ref 0.0–0.2)
nRBC: 0 % (ref 0.0–0.2)

## 2021-09-29 LAB — CBC WITH DIFFERENTIAL/PLATELET
Abs Immature Granulocytes: 0.1 10*3/uL — ABNORMAL HIGH (ref 0.00–0.07)
Basophils Absolute: 0 10*3/uL (ref 0.0–0.1)
Basophils Relative: 0 %
Eosinophils Absolute: 0 10*3/uL (ref 0.0–0.5)
Eosinophils Relative: 0 %
HCT: 35.1 % — ABNORMAL LOW (ref 36.0–46.0)
Hemoglobin: 11.6 g/dL — ABNORMAL LOW (ref 12.0–15.0)
Immature Granulocytes: 1 %
Lymphocytes Relative: 6 %
Lymphs Abs: 0.9 10*3/uL (ref 0.7–4.0)
MCH: 28.6 pg (ref 26.0–34.0)
MCHC: 33 g/dL (ref 30.0–36.0)
MCV: 86.5 fL (ref 80.0–100.0)
Monocytes Absolute: 1.2 10*3/uL — ABNORMAL HIGH (ref 0.1–1.0)
Monocytes Relative: 8 %
Neutro Abs: 12.3 10*3/uL — ABNORMAL HIGH (ref 1.7–7.7)
Neutrophils Relative %: 85 %
Platelets: 260 10*3/uL (ref 150–400)
RBC: 4.06 MIL/uL (ref 3.87–5.11)
RDW: 15.5 % (ref 11.5–15.5)
WBC: 14.5 10*3/uL — ABNORMAL HIGH (ref 4.0–10.5)
nRBC: 0 % (ref 0.0–0.2)

## 2021-09-29 LAB — MAGNESIUM
Magnesium: 10.6 mg/dL (ref 1.7–2.4)
Magnesium: 9 mg/dL (ref 1.7–2.4)
Magnesium: 8.5 mg/dL (ref 1.7–2.4)

## 2021-09-29 MED ORDER — WITCH HAZEL-GLYCERIN EX PADS: 1.0000 "application " | MEDICATED_PAD | CUTANEOUS | Status: DC | PRN

## 2021-09-29 MED ORDER — DIPHENHYDRAMINE HCL 25 MG PO CAPS: 25.0000 mg | ORAL_CAPSULE | Freq: Four times a day (QID) | ORAL | Status: DC | PRN

## 2021-09-29 MED ORDER — DIBUCAINE (PERIANAL) 1 % EX OINT: 1.0000 "application " | TOPICAL_OINTMENT | CUTANEOUS | Status: DC | PRN

## 2021-09-29 MED ORDER — LIDOCAINE HCL (PF) 1 % IJ SOLN
INTRAMUSCULAR | Status: DC | PRN
Start: 2021-09-29 — End: 2021-09-29
  Administered 2021-09-29 (×2): 4 mL via EPIDURAL

## 2021-09-29 MED ORDER — OXYCODONE HCL 5 MG PO TABS: 5.0000 mg | ORAL_TABLET | Freq: Three times a day (TID) | ORAL | Status: DC | PRN

## 2021-09-29 MED ORDER — HYDRALAZINE HCL 20 MG/ML IJ SOLN: 10.0000 mg | INTRAMUSCULAR | Status: DC | PRN

## 2021-09-29 MED ORDER — MEASLES, MUMPS & RUBELLA VAC IJ SOLR: 0.5000 mL | Freq: Once | INTRAMUSCULAR | Status: DC

## 2021-09-29 MED ORDER — SODIUM CHLORIDE 0.9% FLUSH
3.0000 mL | Freq: Two times a day (BID) | INTRAVENOUS | Status: DC
  Administered 2021-09-29 – 2021-09-30 (×3): 3 mL via INTRAVENOUS

## 2021-09-29 MED ORDER — PRENATAL MULTIVITAMIN CH
1.0000 | ORAL_TABLET | Freq: Every day | ORAL | Status: DC
  Administered 2021-09-30 – 2021-10-01 (×2): 1 via ORAL
  Filled 2021-09-29 (×2): qty 1

## 2021-09-29 MED ORDER — SENNOSIDES-DOCUSATE SODIUM 8.6-50 MG PO TABS
2.0000 | ORAL_TABLET | ORAL | Status: DC
  Administered 2021-09-30 – 2021-10-01 (×2): 2 via ORAL
  Filled 2021-09-29 (×2): qty 2

## 2021-09-29 MED ORDER — IBUPROFEN 600 MG PO TABS: 600.0000 mg | ORAL_TABLET | Freq: Four times a day (QID) | ORAL | Status: DC

## 2021-09-29 MED ORDER — TETANUS-DIPHTH-ACELL PERTUSSIS 5-2.5-18.5 LF-MCG/0.5 IM SUSY: 0.5000 mL | PREFILLED_SYRINGE | Freq: Once | INTRAMUSCULAR | Status: DC

## 2021-09-29 MED ORDER — LABETALOL HCL 5 MG/ML IV SOLN: 80.0000 mg | INTRAVENOUS | Status: DC | PRN

## 2021-09-29 MED ORDER — LABETALOL HCL 5 MG/ML IV SOLN
20.0000 mg | INTRAVENOUS | Status: DC | PRN
  Administered 2021-09-29: 20 mg via INTRAVENOUS

## 2021-09-29 MED ORDER — BENZOCAINE-MENTHOL 20-0.5 % EX AERO
1.0000 "application " | INHALATION_SPRAY | CUTANEOUS | Status: DC | PRN
  Administered 2021-09-30: 1 via TOPICAL
  Filled 2021-09-29: qty 56

## 2021-09-29 MED ORDER — ONDANSETRON HCL 4 MG PO TABS: 4.0000 mg | ORAL_TABLET | ORAL | Status: DC | PRN

## 2021-09-29 MED ORDER — COCONUT OIL OIL
1.0000 | TOPICAL_OIL | Status: DC | PRN
Start: 2021-09-29 — End: 2021-10-01

## 2021-09-29 MED ORDER — LABETALOL HCL 5 MG/ML IV SOLN
40.0000 mg | INTRAVENOUS | Status: DC | PRN
  Filled 2021-09-29: qty 8

## 2021-09-29 MED ORDER — SODIUM CHLORIDE 0.9% FLUSH: 3.0000 mL | INTRAVENOUS | Status: DC | PRN

## 2021-09-29 MED ORDER — SIMETHICONE 80 MG PO CHEW: 80.0000 mg | CHEWABLE_TABLET | ORAL | Status: DC | PRN

## 2021-09-29 MED ORDER — LABETALOL HCL 5 MG/ML IV SOLN
INTRAVENOUS | Status: AC
Start: 2021-09-29 — End: 2021-09-29
  Filled 2021-09-29: qty 4

## 2021-09-29 MED ORDER — ONDANSETRON HCL 4 MG/2ML IJ SOLN: 4.0000 mg | INTRAMUSCULAR | Status: DC | PRN

## 2021-09-29 MED ORDER — ZOLPIDEM TARTRATE 5 MG PO TABS: 5.0000 mg | ORAL_TABLET | Freq: Every evening | ORAL | Status: DC | PRN

## 2021-09-29 MED ORDER — NIFEDIPINE ER OSMOTIC RELEASE 30 MG PO TB24
30.0000 mg | ORAL_TABLET | Freq: Every day | ORAL | Status: DC
  Administered 2021-09-29 – 2021-09-30 (×2): 30 mg via ORAL
  Filled 2021-09-29 (×2): qty 1

## 2021-09-29 MED ORDER — TRANEXAMIC ACID-NACL 1000-0.7 MG/100ML-% IV SOLN
1000.0000 mg | INTRAVENOUS | Status: AC
  Administered 2021-09-29: 1000 mg via INTRAVENOUS

## 2021-09-29 MED ORDER — TRANEXAMIC ACID-NACL 1000-0.7 MG/100ML-% IV SOLN
INTRAVENOUS | Status: AC
  Filled 2021-09-29: qty 100

## 2021-09-29 MED ORDER — SODIUM CHLORIDE 0.9 % IV SOLN: 250.0000 mL | INTRAVENOUS | Status: DC | PRN

## 2021-09-29 MED ORDER — ACETAMINOPHEN 325 MG PO TABS: 650.0000 mg | ORAL_TABLET | ORAL | Status: DC | PRN

## 2021-09-29 NOTE — Progress Notes (Signed)
Date and time results received: 09/29/21 1350 ?(use smartphrase ".now" to insert current time) ? ?Test: mag ?Critical Value: 8.5 ? ?Name of Provider Notified: Dr. Annia Friendly ? ?Orders Received? Or Actions Taken?:  no new orders lab trending down ?

## 2021-09-29 NOTE — Anesthesia Procedure Notes (Signed)
Epidural ?Patient location during procedure: OB ?Start time: 09/29/2021 1:19 AM ?End time: 09/29/2021 1:28 AM ? ?Staffing ?Anesthesiologist: Mal Amabile, MD ?Performed: anesthesiologist  ? ?Preanesthetic Checklist ?Completed: patient identified, IV checked, site marked, risks and benefits discussed, surgical consent, monitors and equipment checked, pre-op evaluation and timeout performed ? ?Epidural ?Patient position: sitting ?Prep: DuraPrep and site prepped and draped ?Patient monitoring: continuous pulse ox and blood pressure ?Approach: midline ?Location: L3-L4 ?Injection technique: LOR air ? ?Needle:  ?Needle type: Tuohy  ?Needle gauge: 17 G ?Needle length: 9 cm and 9 ?Needle insertion depth: 6 cm ?Catheter type: closed end flexible ?Catheter size: 19 Gauge ?Catheter at skin depth: 11 cm ?Test dose: negative and Other ? ?Assessment ?Events: blood not aspirated, injection not painful, no injection resistance, no paresthesia and negative IV test ? ?Additional Notes ?Patient identified. Risks and benefits discussed including failed block, incomplete  ?Pain control, post dural puncture headache, nerve damage, paralysis, blood pressure ?Changes, nausea, vomiting, reactions to medications-both toxic and allergic and post ?Partum back pain. All questions were answered. Patient expressed understanding and wished to proceed. Sterile technique was used throughout procedure. Epidural site was ?Dressed with sterile barrier dressing. No paresthesias, signs of intravascular injection ?Or signs of intrathecal spread were encountered.  ?Patient was more comfortable after the epidural was dosed. ?Please see RN's note for documentation of vital signs and FHR which are stable. ?Reason for block:procedure for pain ? ? ? ?

## 2021-09-29 NOTE — Anesthesia Preprocedure Evaluation (Addendum)
Anesthesia Evaluation  ?Patient identified by MRN, date of birth, ID band ?Patient awake ? ? ? ?Reviewed: ?Allergy & Precautions, Patient's Chart, lab work & pertinent test results ? ?Airway ?Mallampati: II ? ?TM Distance: >3 FB ?Neck ROM: Full ? ? ? Dental ?no notable dental hx. ?(+) Teeth Intact ?  ?Pulmonary ?neg pulmonary ROS,  ?  ?Pulmonary exam normal ?breath sounds clear to auscultation ? ? ? ? ? ? Cardiovascular ?negative cardio ROS ?Normal cardiovascular exam ?Rhythm:Regular Rate:Normal ? ? ?  ?Neuro/Psych ?negative neurological ROS ? negative psych ROS  ? GI/Hepatic ?Neg liver ROS, GERD  ,  ?Endo/Other  ?Obesity ? Renal/GU ?negative Renal ROS  ?negative genitourinary ?  ?Musculoskeletal ?Scoliosis ?Spina bifida occulta  ? Abdominal ?(+) + obese,   ?Peds ? Hematology ? ?(+) Blood dyscrasia, anemia ,   ?Anesthesia Other Findings ? ? Reproductive/Obstetrics ?(+) Pregnancy ?Pre Eclampsia on MgSO4 ? ?  ? ? ? ? ? ? ? ? ? ? ? ? ? ?  ?  ? ? ? ? ? ? ? ?Anesthesia Physical ?Anesthesia Plan ? ?ASA: 3 ? ?Anesthesia Plan: Epidural  ? ?Post-op Pain Management:   ? ?Induction:  ? ?PONV Risk Score and Plan:  ? ?Airway Management Planned: Natural Airway ? ?Additional Equipment:  ? ?Intra-op Plan:  ? ?Post-operative Plan:  ? ?Informed Consent: I have reviewed the patients History and Physical, chart, labs and discussed the procedure including the risks, benefits and alternatives for the proposed anesthesia with the patient or authorized representative who has indicated his/her understanding and acceptance.  ? ? ? ? ? ?Plan Discussed with: Anesthesiologist ? ?Anesthesia Plan Comments:   ? ? ? ? ? ?Anesthesia Quick Evaluation ? ?

## 2021-09-29 NOTE — Progress Notes (Signed)
Labor Progress Note ?Beverly Hopkins is a 21 y.o. Hopkins at [redacted]w[redacted]d who presented for IOL due to severe pre-eclampsia. ? ?S: Notified by RN that patient was feeling very lethargic. Discussed with Dr. Jolayne Panther and advised to stop magnesium and obtain labs to confirm magnesium level. Reports that she feels more pressure. No other concerns at this time.  ? ?O:  ?BP 119/79   Pulse 92   Temp 98.2 ?F (36.8 ?C) (Oral)   Resp 14   Ht 4\' 11"  (1.499 m)   Wt 72.6 kg   LMP 12/07/2020 (Approximate)   SpO2 100%   BMI 32.32 kg/m?  ? ?EFM: Baseline 115 bpm, minimal to absent variability, no accels, no decels  ?Toco: Every 3-5 minutes  ? ?CVE: Dilation: 3 ?Effacement (%): 100 ?Cervical Position: Middle ?Station: -1 ?Presentation: Vertex ?Exam by:: [email or web address] RN ? ?A&P: Beverly Hopkins [redacted]w[redacted]d  ? ?#Labor: Progressing well. Will continue Pitocin as tolerated and reassess in 4 hours.  ?#Pain: Epidural  ?#FWB: Cat 2 due to periods of minimal/absent variability. Likely due to elevated magnesium level as discussed below. Holding magnesium for now. Will continue Pitocin as tolerated and monitor closely.  ?#GBS negative ? ?#Severe pre-eclampsia: Mg level 10.6 with elevated creatinine of 1.35, up from 0.97. Magnesium previously stopped at 0218. Will repeat labs at 0600 to reassess.  ? ?Plan of care discussed with Dr. [redacted]w[redacted]d who agrees with management and plan of care.  ? ?Jolayne Panther, MD ?4:16 AM ? ?

## 2021-09-29 NOTE — Discharge Summary (Signed)
? ?  Postpartum Discharge Summary ? ?Date of Service updated 10/01/2021 ? ?   ?Patient Name: Beverly Hopkins ?DOB: October 21, 2000 ?MRN: [id number] ? ?Date of admission: 09/28/2021 ?Delivery date:09/29/2021  ?Delivering provider: Patriciaann Clan  ?Date of discharge:  10/01/2021 ? ? ?Admitting diagnosis: Indication for care in labor or delivery [O75.9] ?Intrauterine pregnancy: [redacted]w[redacted]d    ?Secondary diagnosis:  Principal Problem: ?  Indication for care in labor or delivery ?Active Problems: ?  Spina bifida occulta ?  Scoliosis ?  Pre-eclampsia, severe, with delivery ?  [place] ? ?Additional problems: no    ?Discharge diagnosis: Term Pregnancy Delivered and Preeclampsia (severe)                                              ?Post partum procedures: none ?Augmentation: Pitocin and Cytotec ?Complications: None ? ?Hospital course: Induction of Labor With Vaginal Delivery   ?21y.o. yo G1P0 at 379w5das admitted to the hospital 09/28/2021 for induction of labor.  Indication for induction: Preeclampsia.  Patient had an uncomplicated labor course as follows: ?Membrane Rupture Time/Date: 5:44 PM ,09/28/2021   ?Delivery Method:Vaginal, Spontaneous  ?Episiotomy: None  ?Lacerations:  Periurethral;Vaginal  ?Details of delivery can be found in separate delivery note.  Patient had a routine postpartum course. Patient is discharged home 10/01/21. ? ?Newborn Data: ?Birth date:09/29/2021  ?Birth time:3:44 PM  ?Gender:Female  ?Living status:Living  ?Apgars:6 ,9  ?Weight:3240 g  ? ?Magnesium Sulfate received: Yes: Seizure prophylaxis ?BMZ received: No ?Rhophylac:No ?MMR:No ?T-DaP:Given prenatally ?Flu: No ?Transfusion:No ? ?Physical exam  ?Vitals:  ? 10/01/21 0800 10/01/21 1124 10/01/21 1125 10/01/21 1142  ?BP:  122/80  122/75  ?Pulse:  94  99  ?Resp:  18  17  ?Temp:  99.2 ?F (37.3 ?C)  98.4 ?F (36.9 ?C)  ?TempSrc:  Oral  Oral  ?SpO2: 95% 99% 97% 99%  ?Weight:      ?Height:      ? ?General: alert, cooperative, and no  distress ?Lochia: appropriate ?Uterine Fundus: firm ?Incision: N/A ?DVT Evaluation: No evidence of DVT seen on physical exam. ?Labs: ?Lab Results  ?Component Value Date  ? WBC 14.9 (H) 09/29/2021  ? HGB 11.8 (L) 09/29/2021  ? HCT 36.6 09/29/2021  ? MCV 85.9 09/29/2021  ? PLT 247 09/29/2021  ? ? ?  Latest Ref Rng & Units 10/01/2021  ?  4:40 AM  ?CMP  ?Glucose 70 - 99 mg/dL 87    ?BUN 6 - 20 mg/dL 20    ?Creatinine 0.44 - 1.00 mg/dL 1.52    ?Sodium 135 - 145 mmol/L 137    ?Potassium 3.5 - 5.1 mmol/L 3.7    ?Chloride 98 - 111 mmol/L 105    ?CO2 22 - 32 mmol/L 23    ?Calcium 8.9 - 10.3 mg/dL 7.5    ?Total Protein 6.5 - 8.1 g/dL 4.7    ?Total Bilirubin 0.3 - 1.2 mg/dL 0.3    ?Alkaline Phos 38 - 126 U/L 125    ?AST 15 - 41 U/L 22    ?ALT 0 - 44 U/L 12    ? ?Edinburgh Score: ? ?  09/30/2021  ? 12:49 PM  ?Edinburgh Postnatal Depression Scale Screening Tool  ?I have been able to laugh and see the funny side of things. 0  ?I have looked forward with enjoyment to things. 0  ?  I have blamed myself unnecessarily when things went wrong. 0  ?I have been anxious or worried for no good reason. 0  ?I have felt scared or panicky for no good reason. 0  ?Things have been getting on top of me. 0  ?I have been so unhappy that I have had difficulty sleeping. 0  ?I have felt sad or miserable. 0  ?I have been so unhappy that I have been crying. 0  ?The thought of harming myself has occurred to me. 0  ?Edinburgh Postnatal Depression Scale Total 0  ? ? ? ?After visit meds:  ?Allergies as of 10/01/2021   ? ?   Reactions  ? Amoxicillin-pot Clavulanate Hives  ? Had reaction when she was little (hives). Has had PCN since with no reaction.  ? ?  ? ?  ?Medication List  ?  ? ?STOP taking these medications   ? ?tretinoin 0.025 % cream ?Commonly known as: RETIN-A ?  ?WesTab Plus 27-1 MG Tabs ?  ? ?  ? ?TAKE these medications   ? ?ferrous sulfate 325 (65 FE) MG tablet ?Take 1 tablet (325 mg total) by mouth every other day. ?  ?Flinstones Gummies Omega-3 DHA  Chew ?Chew by mouth. Flinstone-2 daily ?  ?NIFEdipine 30 MG 24 hr tablet ?Commonly known as: ADALAT CC ?Take 1 tablet (30 mg total) by mouth at bedtime. ?  ? ?  ? ? ? ?Discharge home in stable condition ?Infant Feeding: Bottle ?Infant Disposition:home with mother ?Discharge instruction: per After Visit Summary and Postpartum booklet. ?Activity: Advance as tolerated. Pelvic rest for 6 weeks.  ?Diet: routine diet ?Future Appointments: ?Future Appointments  ?Date Time Provider [place]  ?10/06/2021 11:10 AM [facility] NURSE [facility]  ?11/04/2021  1:30 PM Roma Schanz, CNM [facility]  ? ?Follow up Visit: ? Follow-up Information   ? ? [facility] Follow up in 1 week(s).   ?Specialty: Obstetrics and Gynecology ?Why: bp check ?Contact information: ?[address] [place] ?[phone] ? ?  ?  ? ?  ?  ? ?  ? ? ?Message sent to [facility] by Dr Higinio Plan  ?Please schedule this patient for a In person postpartum visit in 6 weeks with the following provider: Any provider. ?Additional Postpartum F/U:BP check 1 week  ?High risk pregnancy complicated by: HTN ?Delivery mode:  Vaginal, Spontaneous  ?Anticipated Birth Control:  Nexplanon ? ? ?10/01/2021 ?Woodroe Mode, MD ? ? ? ? ?

## 2021-09-29 NOTE — Progress Notes (Signed)
Date and time results received: 09/29/21 1819 ? ?Test: Magnesium ?Critical Value: 7.4 ? ?Name of Provider Notified: Dr. Rip Harbour at [phone] ? ?Orders Received? Or Actions Taken?: No new orders  ?

## 2021-09-30 LAB — BASIC METABOLIC PANEL
Anion gap: 6 (ref 5–15)
BUN: 20 mg/dL (ref 6–20)
CO2: 20 mmol/L — ABNORMAL LOW (ref 22–32)
Calcium: 6.8 mg/dL — ABNORMAL LOW (ref 8.9–10.3)
Chloride: 102 mmol/L (ref 98–111)
Creatinine, Ser: 2.17 mg/dL — ABNORMAL HIGH (ref 0.44–1.00)
GFR, Estimated: 33 mL/min — ABNORMAL LOW (ref >60)
Glucose, Bld: 93 mg/dL (ref 70–99)
Potassium: 3.7 mmol/L (ref 3.5–5.1)
Sodium: 128 mmol/L — ABNORMAL LOW (ref 135–145)

## 2021-09-30 LAB — MAGNESIUM: Magnesium: 5.5 mg/dL — ABNORMAL HIGH (ref 1.7–2.4)

## 2021-09-30 MED ORDER — FUROSEMIDE 40 MG PO TABS
20.0000 mg | ORAL_TABLET | Freq: Two times a day (BID) | ORAL | Status: DC
  Administered 2021-09-30: 20 mg via ORAL
  Filled 2021-09-30: qty 1

## 2021-09-30 NOTE — Progress Notes (Addendum)
Post Partum Day 1 ?Subjective: ?no complaints ? ?Objective: ?Blood pressure 125/68, pulse 87, temperature 98.8 ?F (37.1 ?C), temperature source Oral, resp. rate 16, height 4\' 11"  (1.499 m), weight 72.6 kg, last menstrual period 12/07/2020, SpO2 97 %, unknown if currently breastfeeding. ? ?Physical Exam:  ?General: alert, cooperative, and no distress ?Lochia: appropriate ?Uterine Fundus: firm ?DVT Evaluation: No evidence of DVT seen on physical exam. ? ?Recent Labs  ?  09/29/21 ?1151 09/29/21 ?1650  ?HGB 11.6* 11.8*  ?HCT 35.1* 36.6  ? ? ?  Latest Ref Rng & Units 09/30/2021  ?  4:39 AM 09/29/2021  ?  4:50 PM 09/29/2021  ? 11:51 AM  ?CMP  ?Glucose 70 - 99 mg/dL 93   10/01/2021   161    ?BUN 6 - 20 mg/dL 20   17   15     ?Creatinine 0.44 - 1.00 mg/dL 096     0.45    ?Sodium 135 - 145 mmol/L 128   131   132    ?Potassium 3.5 - 5.1 mmol/L 3.7   4.0   3.7    ?Chloride 98 - 111 mmol/L 102   101   102    ?CO2 22 - 32 mmol/L 20   18   19     ?Calcium 8.9 - 10.3 mg/dL 6.8   7.6   7.8    ?Total Protein 6.5 - 8.1 g/dL  5.5   5.9    ?Total Bilirubin 0.3 - 1.2 mg/dL  0.6   0.6    ?Alkaline Phos 38 - 126 U/L  167   168    ?AST 15 - 41 U/L  25   22    ?ALT 0 - 44 U/L  13   13    ? ? ? ?Assessment/Plan: ?Routine care after SVD with preeclampsia. Follow creatinine ? ? ? ? LOS: 2 days  ? ?4.09 ?09/30/2021, 10:30 AM  ? ? ?

## 2021-09-30 NOTE — Anesthesia Postprocedure Evaluation (Signed)
Anesthesia Post Note ? ?Patient: Beverly Hopkins ? ?Procedure(s) Performed: AN AD HOC LABOR EPIDURAL ? ?  ? ?Patient location during evaluation: Mother Baby ?Anesthesia Type: Epidural ?Level of consciousness: awake and alert and oriented ?Pain management: satisfactory to patient ?Vital Signs Assessment: post-procedure vital signs reviewed and stable ?Respiratory status: respiratory function stable ?Cardiovascular status: stable ?Postop Assessment: no headache, no backache, epidural receding, patient able to bend at knees, no signs of nausea or vomiting, adequate PO intake and able to ambulate ?Anesthetic complications: no ? ? ?No notable events documented. ? ?Last Vitals:  ?Vitals:  ? 09/30/21 0500 09/30/21 0759  ?BP:  125/68  ?Pulse:  87  ?Resp:  16  ?Temp:  37.1 ?C  ?SpO2: 100% 97%  ?  ?Last Pain:  ?Vitals:  ? 09/30/21 0759  ?TempSrc: Oral  ?PainSc:   ? ?Pain Goal: Patients Stated Pain Goal: 0 (09/29/21 0032) ? ?  ?  ?  ?  ?  ?  ?  ? ?Azelyn Batie ? ? ? ? ?

## 2021-10-01 ENCOUNTER — Encounter: Payer: Medicaid Other | Admitting: Obstetrics & Gynecology

## 2021-10-01 ENCOUNTER — Other Ambulatory Visit (HOSPITAL_COMMUNITY): Payer: Self-pay

## 2021-10-01 LAB — COMPREHENSIVE METABOLIC PANEL
ALT: 12 U/L (ref 0–44)
AST: 22 U/L (ref 15–41)
Albumin: 1.9 g/dL — ABNORMAL LOW (ref 3.5–5.0)
Alkaline Phosphatase: 125 U/L (ref 38–126)
Anion gap: 9 (ref 5–15)
BUN: 20 mg/dL (ref 6–20)
CO2: 23 mmol/L (ref 22–32)
Calcium: 7.5 mg/dL — ABNORMAL LOW (ref 8.9–10.3)
Chloride: 105 mmol/L (ref 98–111)
Creatinine, Ser: 1.52 mg/dL — ABNORMAL HIGH (ref 0.44–1.00)
GFR, Estimated: 50 mL/min — ABNORMAL LOW (ref >60)
Glucose, Bld: 87 mg/dL (ref 70–99)
Potassium: 3.7 mmol/L (ref 3.5–5.1)
Sodium: 137 mmol/L (ref 135–145)
Total Bilirubin: 0.3 mg/dL (ref 0.3–1.2)
Total Protein: 4.7 g/dL — ABNORMAL LOW (ref 6.5–8.1)

## 2021-10-01 LAB — MAGNESIUM: Magnesium: 7.4 mg/dL (ref 1.7–2.4)

## 2021-10-01 LAB — SURGICAL PATHOLOGY

## 2021-10-01 MED ORDER — NIFEDIPINE ER 30 MG PO TB24
30.0000 mg | ORAL_TABLET | Freq: Every day | ORAL | 1 refills | Status: DC
  Filled 2021-10-01: qty 30, 30d supply, fill #0

## 2021-10-01 NOTE — Plan of Care (Signed)
Patient to be discharged home with printed instructions. No concerns noted. Sudiksha Victor L Tyshia Fenter, RN  

## 2021-10-06 ENCOUNTER — Ambulatory Visit (INDEPENDENT_AMBULATORY_CARE_PROVIDER_SITE_OTHER): Payer: Medicaid Other | Admitting: *Deleted

## 2021-10-06 ENCOUNTER — Other Ambulatory Visit: Payer: Self-pay

## 2021-10-06 VITALS — BP 126/89 | HR 97 | Wt 130.0 lb

## 2021-10-06 DIAGNOSIS — Z0130 Encounter for examination of blood pressure without abnormal findings: Secondary | ICD-10-CM

## 2021-10-06 NOTE — Progress Notes (Signed)
? ?  NURSE VISIT- BLOOD PRESSURE CHECK ? ?SUBJECTIVE:  ?Beverly Hopkins is a 21 y.o. G70P1001 female here for BP check. She is postpartum, delivery date 09/29/21    ? ?HYPERTENSION ROS: ? ?Postpartum:  ?Severe headaches that don't go away with tylenol/other medicines: No  ?Visual changes (seeing spots/double/blurred vision) No  ?Severe pain under right breast breast or in center of upper chest No  ?Severe nausea/vomiting No  ?Taking medicines as instructed yes ? ?OBJECTIVE:  ?BP 126/89 (BP Location: Left Arm, Patient Position: Sitting, Cuff Size: Normal)   Pulse 97   Wt 130 lb (59 kg)   Breastfeeding No   BMI 26.26 kg/m?   ?Appearance alert, well appearing, and in no distress and oriented to person, place, and time. ? ?ASSESSMENT: ?Postpartum  blood pressure check ? ?PLAN: ?Discussed with Joellyn Haff, CNM, WHNP   ?Recommendations: stop medicine 2 days before next visit   ?Follow-up: as scheduled  ? ?Debbe Odea Sherrye Puga  ?10/06/2021 ?11:06 AM ? ?

## 2021-10-08 ENCOUNTER — Encounter: Payer: Medicaid Other | Admitting: Obstetrics & Gynecology

## 2021-10-08 ENCOUNTER — Inpatient Hospital Stay (HOSPITAL_COMMUNITY): Admit: 2021-10-08 | Payer: Self-pay

## 2021-10-09 ENCOUNTER — Telehealth (HOSPITAL_COMMUNITY): Payer: Self-pay

## 2021-10-09 NOTE — Telephone Encounter (Addendum)
No phone call placed, opened in error. Patient has been seen in office since delivery. ? ?[name] ?10/09/21 ?

## 2021-11-04 ENCOUNTER — Encounter: Payer: Self-pay | Admitting: Women's Health

## 2021-11-04 ENCOUNTER — Ambulatory Visit (INDEPENDENT_AMBULATORY_CARE_PROVIDER_SITE_OTHER): Payer: Medicaid Other | Admitting: Women's Health

## 2021-11-04 DIAGNOSIS — N179 Acute kidney failure, unspecified: Secondary | ICD-10-CM | POA: Diagnosis not present

## 2021-11-04 DIAGNOSIS — Z30017 Encounter for initial prescription of implantable subdermal contraceptive: Secondary | ICD-10-CM | POA: Diagnosis not present

## 2021-11-04 DIAGNOSIS — Z3202 Encounter for pregnancy test, result negative: Secondary | ICD-10-CM

## 2021-11-04 LAB — POCT URINE PREGNANCY: Preg Test, Ur: NEGATIVE

## 2021-11-04 MED ORDER — ETONOGESTREL 68 MG ~~LOC~~ IMPL
68.0000 mg | DRUG_IMPLANT | Freq: Once | SUBCUTANEOUS | Status: AC
  Administered 2021-11-04: 68 mg via SUBCUTANEOUS

## 2021-11-04 NOTE — Progress Notes (Addendum)
? ?POSTPARTUM VISIT ?Patient name: Beverly Hopkins MRN [id number]  Date of birth: 2001-01-07 ?Chief Complaint:   ?Postpartum Care ? ?History of Present Illness:   ?Beverly Hopkins is a 21 y.o. G16P1001 Caucasian female being seen today for a postpartum visit. She is 5 weeks postpartum following a spontaneous vaginal delivery at 38.5 gestational weeks. IOL: yes, for preeclampsia with severe features. Had AKI (Cr 2.25) w/ mag toxicity (10.6) Anesthesia: epidural.  Laceration: periurethral, not repaired  Complications: none. Inpatient contraception: no.   ?Pregnancy complicated by severe pre-e . ?Tobacco use: no. Substance use disorder: no. ?Last pap smear: <21yo and results were N/A. Next pap smear due: @ 22yo ?No LMP recorded (lmp unknown). ? ?Postpartum course has been complicated by PPHTN, d/c'd on procardia, stopped as directed . Bleeding none. Bowel function is normal. Bladder function is normal. Urinary incontinence? no, fecal incontinence? no ?Patient is sexually active. Last sexual activity:  2 days ago, 1/2 way pulled out . Desired contraception: Nexplanon. Discussed possible she could have gotten pregnant 2d ago, benefit of placing nexplanon today outweighs not placing, no harm to early pregnancy.. If pregnant, will remove Nexplanon. Patient does want a pregnancy in the future.  Desired family size is 2-3 children.  ? Upstream - 11/04/21 1333   ? ?  ? Pregnancy Intention Screening  ? Does the patient want to become pregnant in the next year? No   ? Does the patient's partner want to become pregnant in the next year? No   ? Would the patient like to discuss contraceptive options today? Yes   ?  ? Contraception Wrap Up  ? Current Method Withdrawal or Other Method   ? End Method Hormonal Implant   ? Contraception Counseling Provided Yes   ? ?  ?  ? ?  ? ?The pregnancy intention screening data noted above was reviewed. Potential methods of contraception were discussed. The patient elected to proceed with  Hormonal Implant. ? ?Edinburgh Postpartum Depression Screening: negative ? Edinburgh Postnatal Depression Scale - 11/04/21 1330   ? ?  ? Edinburgh Postnatal Depression Scale:  In the Past 7 Days  ? I have been able to laugh and see the funny side of things. 0   ? I have looked forward with enjoyment to things. 0   ? I have blamed myself unnecessarily when things went wrong. 0   ? I have been anxious or worried for no good reason. 0   ? I have felt scared or panicky for no good reason. 0   ? Things have been getting on top of me. 2   ? I have been so unhappy that I have had difficulty sleeping. 1   ? I have felt sad or miserable. 1   ? I have been so unhappy that I have been crying. 0   ? The thought of harming myself has occurred to me. 0   ? Edinburgh Postnatal Depression Scale Total 4   ? ?  ?  ? ?  ?  ? ?  07/16/2021  ?  8:59 AM 03/25/2021  ?  8:54 AM  ?GAD 7 : Generalized Anxiety Score  ?Nervous, Anxious, on Edge 0 0  ?Control/stop worrying 0 0  ?Worry too much - different things 0 0  ?Trouble relaxing 0 0  ?Restless 1 0  ?Easily annoyed or irritable 1 1  ?Afraid - awful might happen 0 0  ?Total GAD 7 Score 2 1  ? ? ? ?Baby's course has been  uncomplicated. Baby is feeding by bottle. Infant has a pediatrician/family doctor? Yes.  Childcare strategy if returning to work/school: family.  Pt has material needs met for her and baby: Yes.   ?Review of Systems:   ?Pertinent items are noted in HPI ?Denies Abnormal vaginal discharge w/ itching/odor/irritation, headaches, visual changes, shortness of breath, chest pain, abdominal pain, severe nausea/vomiting, or problems with urination or bowel movements. ?Pertinent History Reviewed:  ?Reviewed past medical,surgical, obstetrical and family history.  ?Reviewed problem list, medications and allergies. ?OB History  ?Gravida Para Term Preterm AB Living  ?'1 1 1     1  ' ?SAB IAB Ectopic Multiple Live Births  ?      0 1  ?  ?# Outcome Date GA Lbr Len/2nd Weight Sex Delivery Anes PTL  Lv  ?1 Term 09/29/21 [id number]:21 / 01:39 7 lb 2.3 oz (3.24 kg) F Vag-Spont EPI  LIV  ?   Birth Comments: Term female with initial apnea; resp effort improved by 3-4 min of life. Left arm with limited tone initially  ? ?Physical Assessment:  ? ?Vitals:  ? 11/04/21 1329  ?BP: 108/76  ?Pulse: (!) 103  ?Weight: 117 lb 6.4 oz (53.3 kg)  ?Height: 4' 10.5" (1.486 m)  ?Body mass index is 24.12 kg/m?. ? ?     Physical Examination:  ? General appearance: alert, well appearing, and in no distress ? Mental status: alert, oriented to person, place, and time ? Skin: warm & dry  ? Cardiovascular: normal heart rate noted  ? Respiratory: normal respiratory effort, no distress  ? Breasts: deferred, no complaints  ? Abdomen: soft, non-tender  ? Pelvic: examination not indicated. Thin prep pap obtained: No ? Rectal: not examined ? Extremities: Edema: none  ? ?Chaperone: N/A   ?      ?Results for orders placed or performed in visit on 11/04/21 (from the past 24 hour(s))  ?POCT urine pregnancy  ? Collection Time: 11/04/21  1:59 PM  ?Result Value Ref Range  ? Preg Test, Ur Negative Negative  ?  ? ?NEXPLANON INSERTION ? ?Risks/benefits/side effects of Nexplanon have been discussed and her questions have been answered.  Specifically, a failure rate of 07/998 has been reported, with an increased failure rate if pt takes SKahlotusand/or antiseizure medicaitons.  She is aware of the common side effect of irregular bleeding, which the incidence of decreases over time. Signed copy of informed consent in chart.  ? ?Time out was performed. ? ?She is right-handed, so her left arm, approximately 10cm from the medial epicondyle and 3-5cm posterior to the sulcus, was cleansed with alcohol and anesthetized with 2cc of 2% Lidocaine.  The area was cleansed again with betadine and the Nexplanon was inserted per manufacturer's recommendations without difficulty.  3 steri-strips and pressure bandage were applied. The patient tolerated the procedure  well.  ? ?Assessment & Plan:  ?1) Postpartum exam ?2) 5 wks s/p spontaneous vaginal delivery after IOL for severe pre-e ?3) bottle feeding ?4) Depression screening ?5) Nexplanon insertion ?Pt was instructed to keep the area clean and dry, remove pressure bandage in 24 hours, and keep insertion site covered with the steri-strip for 3-5 days.  Condoms for 2 weeks.  She was given a card indicating date Nexplanon was inserted and date it needs to be removed. Follow-up PRN problems. ?6) AKI during hospitalization> last Cr 1.52, will check today ? ?Essential components of care per ACOG recommendations: ? ?1.  Mood and well being:  ?If  positive depression screen, discussed and plan developed.  ?If using tobacco we discussed reduction/cessation and risk of relapse ?If current substance abuse, we discussed and referral to local resources was offered.  ? ?2. Infant care and feeding:  ?If breastfeeding, discussed returning to work, pumping, breastfeeding-associated pain, guidance regarding return to fertility while lactating if not using another method. If needed, patient was provided with a letter to be allowed to pump q 2-3hrs to support lactation in a private location with access to a refrigerator to store breastmilk.   ?Recommended that all caregivers be immunized for flu, pertussis and other preventable communicable diseases ?If pt does not have material needs met for her/baby, referred to local resources for help obtaining these. ? ?3. Sexuality, contraception and birth spacing ?Provided guidance regarding sexuality, management of dyspareunia, and resumption of intercourse ?Discussed avoiding interpregnancy interval <2mhs and recommended birth spacing of 18 months ? ?4. Sleep and fatigue ?Discussed coping options for fatigue and sleep disruption ?Encouraged family/partner/community support of 4 hrs of uninterrupted sleep to help with mood and fatigue ? ?5. Physical recovery  ?If pt had a C/S, assessed incisional pain  and providing guidance on normal vs prolonged recovery ?If pt had a laceration, perineal healing and pain reviewed.  ?If urinary or fecal incontinence, discussed management and referred to PT or uro/gyn if ind

## 2021-11-05 LAB — BASIC METABOLIC PANEL
BUN/Creatinine Ratio: 13 (ref 9–23)
BUN: 11 mg/dL (ref 6–20)
CO2: 21 mmol/L (ref 20–29)
Calcium: 10 mg/dL (ref 8.7–10.2)
Chloride: 104 mmol/L (ref 96–106)
Creatinine, Ser: 0.84 mg/dL (ref 0.57–1.00)
Glucose: 95 mg/dL (ref 70–99)
Potassium: 4.4 mmol/L (ref 3.5–5.2)
Sodium: 138 mmol/L (ref 134–144)
eGFR: 103 mL/min/{1.73_m2} (ref >59)

## 2024-01-01 ENCOUNTER — Ambulatory Visit
Admission: EM | Admit: 2024-01-01 | Discharge: 2024-01-01 | Disposition: A | Discharge status: Home / Self Care | Payer: MEDICAID | Attending: Family Medicine | Admitting: Family Medicine

## 2024-01-01 DIAGNOSIS — H9201 Otalgia, right ear: Secondary | ICD-10-CM

## 2024-01-01 DIAGNOSIS — J069 Acute upper respiratory infection, unspecified: Secondary | ICD-10-CM | POA: Diagnosis not present

## 2024-01-01 LAB — POC COVID19/FLU A&B COMBO
Covid Antigen, POC: NEGATIVE
Influenza A Antigen, POC: NEGATIVE
Influenza B Antigen, POC: NEGATIVE

## 2024-01-01 MED ORDER — PROMETHAZINE-DM 6.25-15 MG/5ML PO SYRP: 5.0000 mL | ORAL_SOLUTION | Freq: Four times a day (QID) | ORAL | 0 refills | Status: AC | PRN

## 2024-01-01 MED ORDER — AZELASTINE HCL 0.1 % NA SOLN
1.0000 | Freq: Two times a day (BID) | NASAL | 0 refills | Status: AC
Start: 2024-01-01 — End: ?

## 2024-01-01 NOTE — ED Provider Notes (Signed)
 [facility]    CSN: [id number] Arrival date & time: 01/01/24  1125      History   Chief Complaint No chief complaint on file.   HPI Beverly Hopkins is a 23 y.o. female.   Presenting today with 1 day history of congestion, cough, sore throat, right ear pain.  Denies fever, chills, chest pain, shortness of breath, abdominal pain, vomiting, diarrhea.  So far trying Alka-Seltzer cold and sinus with minimal relief.  No known history of chronic pulmonary disease.    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 Authorizing Provider  azelastine (ASTELIN) 0.1 % nasal spray Place 1 spray into both nostrils 2 (two) times daily. Use in each nostril as directed 01/01/24  Yes Stuart Vernell Norris, PA-C  promethazine-dextromethorphan (PROMETHAZINE-DM) 6.25-15 MG/5ML syrup Take 5 mLs by mouth 4 (four) times daily as needed. 01/01/24  Yes Stuart Vernell Norris, PA-C  ferrous sulfate  325 (65 FE) MG tablet Take 1 tablet (325 mg total) by mouth every other day. 07/19/21   Kizzie Suzen SAUNDERS, CNM    Family History Family History  Problem Relation Age of Onset   Spina bifida Brother     Social History Social History   Tobacco Use   Smoking status: Never   Smokeless tobacco: Never  Vaping Use   Vaping status: Former  Substance Use Topics    Alcohol use: Never   Drug use: Never     Allergies   Amoxicillin-pot clavulanate   Review of Systems Review of Systems Per HPI  Physical Exam Triage Vital Signs ED Triage Vitals  Encounter Vitals Group     BP 01/01/24 1138 116/80     Girls Systolic BP Percentile --      Girls Diastolic BP Percentile --      Boys Systolic BP Percentile --      Boys Diastolic BP Percentile --      Pulse Rate 01/01/24 1138 83     Resp 01/01/24 1138 20     Temp 01/01/24 1138 98.1 F (36.7 C)     Temp Source 01/01/24 1138 Oral     SpO2 01/01/24 1138 97 %     Weight --      Height --      Head Circumference --      Peak Flow --      Pain Score 01/01/24 1140 5     Pain Loc --      Pain Education --      Exclude from Growth Chart --  No data found.  Updated Vital Signs BP 116/80 (BP Location: Right Arm)   Pulse 83   Temp 98.1 F (36.7 C) (Oral)   Resp 20   SpO2 97%   Visual Acuity Right Eye Distance:   Left Eye Distance:   Bilateral Distance:    Right Eye Near:   Left Eye Near:    Bilateral Near:     Physical Exam Vitals and nursing note reviewed.  Constitutional:      Appearance: Normal appearance.  HENT:     Head: Atraumatic.     Right Ear: Tympanic membrane and external ear normal.     Left Ear: Tympanic membrane and external ear normal.     Nose: Rhinorrhea present.     Mouth/Throat:     Mouth: Mucous membranes are moist.     Pharynx: Posterior oropharyngeal erythema present.   Eyes:     Extraocular Movements: Extraocular movements intact.     Conjunctiva/sclera: Conjunctivae normal.    Cardiovascular:     Rate and Rhythm: Normal rate and regular rhythm.     Heart sounds: Normal heart sounds.  Pulmonary:     Effort: Pulmonary effort is normal.     Breath sounds: Normal breath sounds. No wheezing or rales.   Musculoskeletal:        General: Normal range of motion.     Cervical back: Normal range of motion and neck supple.   Skin:    General: Skin is  warm and dry.   Neurological:     Mental Status: She is alert and oriented to person, place, and time.   Psychiatric:        Mood and Affect: Mood normal.        Thought Content: Thought content normal.      UC Treatments / Results  Labs (all labs ordered are listed, but only abnormal results are displayed) Labs Reviewed  POC COVID19/FLU A&B COMBO    EKG   Radiology No results found.  Procedures Procedures (including critical care time)  Medications Ordered in UC Medications - No data to display  Initial Impression / Assessment and Plan / UC Course  I have reviewed the triage vital signs and the nursing notes.  Pertinent labs & imaging results that were available during my care of the patient were reviewed by me and considered in my medical decision making (see chart for details).     Rapid flu and COVID-negative, vitals and exam overall reassuring and suggestive of a viral respiratory infection.  No evidence of an ear infection or other bacterial cause today.  Treat with Astelin nasal spray, Phenergan DM, supportive over-the-counter medications and home care.  Return for worsening symptoms.  Final Clinical Impressions(s) / UC Diagnoses   Final diagnoses:  Viral URI with cough  Right ear pain   Discharge Instructions   None    ED Prescriptions     Medication Sig Dispense Auth. Provider   azelastine (ASTELIN) 0.1 % nasal spray Place 1 spray into both nostrils 2 (two) times daily. Use in each nostril as directed 30 mL Stuart Vernell Norris, PA-C   promethazine-dextromethorphan (PROMETHAZINE-DM) 6.25-15 MG/5ML syrup Take 5 mLs by mouth 4 (four) times daily as needed. 100 mL Stuart Vernell Norris, [place]      PDMP not reviewed this encounter.   Stuart Vernell Norris, PA-C 01/01/24 1347

## 2024-01-01 NOTE — ED Triage Notes (Signed)
 Pt reports right ear pain and cough x 1 day.

## 2024-08-26 ENCOUNTER — Ambulatory Visit: Payer: MEDICAID | Admitting: Women's Health
